# Patient Record
Sex: Male | Born: 1940
Health system: Southern US, Community
[De-identification: ages and names within clinical notes are randomized; demographics above are authoritative.]

## PROBLEM LIST (undated history)

## (undated) DIAGNOSIS — E785 Hyperlipidemia, unspecified: Secondary | ICD-10-CM

## (undated) DIAGNOSIS — F419 Anxiety disorder, unspecified: Secondary | ICD-10-CM

## (undated) HISTORY — PX: TRIGGER FINGER RELEASE: SHX641

## (undated) HISTORY — DX: Hyperlipidemia, unspecified: E78.5

## (undated) HISTORY — DX: Anxiety disorder, unspecified: F41.9

## (undated) HISTORY — PX: TONSILLECTOMY AND ADENOIDECTOMY: SUR1326

## (undated) HISTORY — PX: OTHER SURGICAL HISTORY: SHX169

---

## 2011-10-26 ENCOUNTER — Encounter: Payer: Self-pay | Admitting: Gastroenterology

## 2012-05-29 ENCOUNTER — Other Ambulatory Visit: Payer: Self-pay | Admitting: Internal Medicine

## 2012-05-29 DIAGNOSIS — R0989 Other specified symptoms and signs involving the circulatory and respiratory systems: Secondary | ICD-10-CM

## 2012-06-02 ENCOUNTER — Ambulatory Visit
Admission: RE | Admit: 2012-06-02 | Discharge: 2012-06-02 | Disposition: A | Discharge status: Home / Self Care | Payer: Medicare Other | Source: Ambulatory Visit | Attending: Internal Medicine | Admitting: Internal Medicine

## 2012-06-02 DIAGNOSIS — R0989 Other specified symptoms and signs involving the circulatory and respiratory systems: Secondary | ICD-10-CM

## 2014-08-15 ENCOUNTER — Encounter: Payer: Self-pay | Admitting: Gastroenterology

## 2015-03-27 ENCOUNTER — Encounter: Payer: Self-pay | Admitting: Gastroenterology

## 2015-05-30 ENCOUNTER — Encounter: Payer: Self-pay | Admitting: Gastroenterology

## 2015-08-03 ENCOUNTER — Ambulatory Visit (INDEPENDENT_AMBULATORY_CARE_PROVIDER_SITE_OTHER): Payer: Medicare Other | Admitting: Gastroenterology

## 2015-08-03 ENCOUNTER — Encounter: Payer: Self-pay | Admitting: Gastroenterology

## 2015-08-03 VITALS — BP 122/70 | HR 80 | Ht 69.5 in | Wt 155.4 lb

## 2015-08-03 DIAGNOSIS — Z1211 Encounter for screening for malignant neoplasm of colon: Secondary | ICD-10-CM | POA: Diagnosis not present

## 2015-08-03 DIAGNOSIS — K5730 Diverticulosis of large intestine without perforation or abscess without bleeding: Secondary | ICD-10-CM | POA: Diagnosis not present

## 2015-08-03 MED ORDER — NA SULFATE-K SULFATE-MG SULF 17.5-3.13-1.6 GM/177ML PO SOLN: 1.0000 | Freq: Once | ORAL | Status: DC

## 2015-08-03 NOTE — Assessment & Plan Note (Signed)
Plan screening colonoscopy  CC Dr. Nyoka Cowden

## 2015-08-03 NOTE — Patient Instructions (Signed)

## 2015-08-03 NOTE — Progress Notes (Signed)
    _                                                                                                                History of Present Illness:  Mr. Nicholas Neal is a pleasant 74 year old white male referred at the request of Dr. Nyoka Cowden for screening colonoscopy.  Last examined 2005 was pertinent for scattered right colon diverticula.  He has no GI complaints including change in bowel habits, abdominal pain, melanoma or hematochezia.   Past Medical History  Diagnosis Date  . Hyperlipidemia    Past Surgical History  Procedure Laterality Date  . Trigger finger release Left     pinky finger  . Tonsillectomy and adenoidectomy      as a child   family history includes Alzheimer's disease in his father; Heart disease in his brother; Hypertension in his mother; Stroke in his mother. There is no history of Colon cancer. Current Outpatient Prescriptions  Medication Sig Dispense Refill  . atorvastatin (LIPITOR) 10 MG tablet Take 10 mg by mouth daily.    . MULTIPLE VITAMIN PO Take 1 tablet by mouth daily.     No current facility-administered medications for this visit.   Allergies as of 08/03/2015  . (No Known Allergies)    reports that he has never smoked. He has never used smokeless tobacco. He reports that he drinks alcohol. He reports that he does not use illicit drugs.   Review of Systems: Pertinent positive and negative review of systems were noted in the above HPI section. All other review of systems were otherwise negative.  Vital signs were reviewed in today's medical record Physical Exam: General: Well developed , well nourished, no acute distress Skin: anicteric Head: Normocephalic and atraumatic Eyes:  sclerae anicteric, EOMI Ears: Normal auditory acuity Mouth: No deformity or lesions Neck: Supple, no masses or thyromegaly Lymph Nodes: no lymphadenopathy Lungs: Clear throughout to auscultation Heart: Regular rate and rhythm; no murmurs, rubs or  bruits Gastroinestinal: Soft, non tender and non distended. No masses, hepatosplenomegaly or hernias noted. Normal Bowel sounds Rectal:deferred Musculoskeletal: Symmetrical with no gross deformities  Skin: No lesions on visible extremities Pulses:  Normal pulses noted Extremities: No clubbing, cyanosis, edema or deformities noted Neurological: Alert oriented x 4, grossly nonfocal Cervical Nodes:  No significant cervical adenopathy Inguinal Nodes: No significant inguinal adenopathy Psychological:  Alert and cooperative. Normal mood and affect  See Assessment and Plan under Problem List

## 2015-08-07 ENCOUNTER — Encounter: Payer: Self-pay | Admitting: Gastroenterology

## 2015-08-28 ENCOUNTER — Encounter: Payer: Medicare Other | Admitting: Gastroenterology

## 2015-10-24 ENCOUNTER — Ambulatory Visit (AMBULATORY_SURGERY_CENTER): Payer: Medicare Other | Admitting: Internal Medicine

## 2015-10-24 ENCOUNTER — Encounter: Payer: Self-pay | Admitting: Internal Medicine

## 2015-10-24 VITALS — BP 99/71 | HR 74 | Temp 95.9°F | Resp 14 | Ht 69.0 in | Wt 155.0 lb

## 2015-10-24 DIAGNOSIS — Z1211 Encounter for screening for malignant neoplasm of colon: Secondary | ICD-10-CM | POA: Diagnosis present

## 2015-10-24 MED ORDER — SODIUM CHLORIDE 0.9 % IV SOLN: 500.0000 mL | INTRAVENOUS | Status: DC

## 2015-10-24 NOTE — Op Note (Signed)
[place]  [address]. [place], [address]   COLONOSCOPY PROCEDURE REPORT  PATIENT: Nicholas Neal, Nicholas Neal  MR#: [id number] BIRTHDATE: [birth year] , 56  yrs. old GENDER: male ENDOSCOPIST: Eustace Quail, MD REFERRED [id number] Recall, PROCEDURE DATE:  10/24/2015 PROCEDURE:   Colonoscopy, screening First Screening Colonoscopy - Avg.  risk and is 50 yrs.  old or older - No.  Prior Negative Screening - Now for repeat screening. 10 or more years since last screening  History of Adenoma - Now for follow-up colonoscopy & has been > or = to 3 yrs.  N/A  Polyps removed today? No Recommend repeat exam, <10 yrs? No ASA CLASS:   Class II INDICATIONS:Screening for colonic neoplasia and Colorectal Neoplasm Risk Assessment for this procedure is average risk.. Negative index examination 2005 with Dr. Deatra Ina MEDICATIONS: Monitored anesthesia care and Propofol 200 mg IV  DESCRIPTION OF PROCEDURE:   After the risks benefits and alternatives of the procedure were thoroughly explained, informed consent was obtained.  The digital rectal exam revealed no abnormalities of the rectum.   The LB [id number]  endoscope was introduced through the anus and advanced to the cecum, which was identified by both the appendix and ileocecal valve. No adverse events experienced.   The quality of the prep was excellent. (Suprep was used)  The instrument was then slowly withdrawn as the colon was fully examined. Estimated blood loss is zero unless otherwise noted in this procedure report.     COLON FINDINGS: There was mild diverticulosis noted in the right colon and left colon.   The examination was otherwise normal. Retroflexed views revealed internal hemorrhoids. The time to cecum = 2.8 Withdrawal time = 6.0   The scope was withdrawn and the procedure completed. COMPLICATIONS: There were no immediate complications.  ENDOSCOPIC IMPRESSION: 1.   Mild diverticulosis was noted in the right  colon and left colon  2.   The examination was otherwise normal  RECOMMENDATIONS: 1. Return to the care of your primary provider.  GI follow up as needed  eSigned:  Eustace Quail, MD 10/24/2015 11:21 AM   cc: The Patient and Levin Erp, MD

## 2015-10-24 NOTE — Patient Instructions (Signed)

## 2015-10-24 NOTE — Progress Notes (Signed)
Report to PACU, RN, vss, BBS= Clear.  

## 2015-10-25 ENCOUNTER — Telehealth: Payer: Self-pay | Admitting: *Deleted

## 2015-10-25 NOTE — Telephone Encounter (Signed)
  Follow up Call-  Call back number 10/24/2015  Post procedure Call Back phone  # [phone]  Permission to leave phone message Yes     Patient questions:  Do you have a fever, pain , or abdominal swelling? No. Pain Score  0 *  Have you tolerated food without any problems? Yes.    Have you been able to return to your normal activities? Yes.    Do you have any questions about your discharge instructions: Diet   No. Medications  No. Follow up visit  No.  Do you have questions or concerns about your Care? No.  Actions: * If pain score is 4 or above: No action needed, pain <4.

## 2016-06-14 ENCOUNTER — Other Ambulatory Visit: Payer: Self-pay | Admitting: Internal Medicine

## 2016-06-14 DIAGNOSIS — E041 Nontoxic single thyroid nodule: Secondary | ICD-10-CM

## 2016-06-14 DIAGNOSIS — Z0000 Encounter for general adult medical examination without abnormal findings: Secondary | ICD-10-CM | POA: Diagnosis not present

## 2016-06-14 DIAGNOSIS — E7800 Pure hypercholesterolemia, unspecified: Secondary | ICD-10-CM | POA: Diagnosis not present

## 2016-06-18 ENCOUNTER — Other Ambulatory Visit: Payer: Medicare Other

## 2016-06-24 ENCOUNTER — Ambulatory Visit
Admission: RE | Admit: 2016-06-24 | Discharge: 2016-06-24 | Disposition: A | Discharge status: Home / Self Care | Payer: PPO | Source: Ambulatory Visit | Attending: Internal Medicine | Admitting: Internal Medicine

## 2016-06-24 DIAGNOSIS — E041 Nontoxic single thyroid nodule: Secondary | ICD-10-CM

## 2016-06-24 DIAGNOSIS — E042 Nontoxic multinodular goiter: Secondary | ICD-10-CM | POA: Diagnosis not present

## 2016-10-15 DIAGNOSIS — Z961 Presence of intraocular lens: Secondary | ICD-10-CM | POA: Diagnosis not present

## 2016-10-15 DIAGNOSIS — H40013 Open angle with borderline findings, low risk, bilateral: Secondary | ICD-10-CM | POA: Diagnosis not present

## 2017-06-16 DIAGNOSIS — Z6841 Body Mass Index (BMI) 40.0 and over, adult: Secondary | ICD-10-CM | POA: Diagnosis not present

## 2017-06-16 DIAGNOSIS — E785 Hyperlipidemia, unspecified: Secondary | ICD-10-CM | POA: Diagnosis not present

## 2017-06-16 DIAGNOSIS — Z0000 Encounter for general adult medical examination without abnormal findings: Secondary | ICD-10-CM | POA: Diagnosis not present

## 2017-10-16 DIAGNOSIS — H40013 Open angle with borderline findings, low risk, bilateral: Secondary | ICD-10-CM | POA: Diagnosis not present

## 2017-10-16 DIAGNOSIS — Z961 Presence of intraocular lens: Secondary | ICD-10-CM | POA: Diagnosis not present

## 2018-02-21 ENCOUNTER — Emergency Department (HOSPITAL_BASED_OUTPATIENT_CLINIC_OR_DEPARTMENT_OTHER)
Admission: EM | Admit: 2018-02-21 | Discharge: 2018-02-21 | Disposition: A | Discharge status: Home / Self Care | Payer: PPO | Attending: Emergency Medicine | Admitting: Emergency Medicine

## 2018-02-21 ENCOUNTER — Other Ambulatory Visit: Payer: Self-pay

## 2018-02-21 ENCOUNTER — Encounter (HOSPITAL_BASED_OUTPATIENT_CLINIC_OR_DEPARTMENT_OTHER): Payer: Self-pay | Admitting: Emergency Medicine

## 2018-02-21 DIAGNOSIS — Y999 Unspecified external cause status: Secondary | ICD-10-CM | POA: Diagnosis not present

## 2018-02-21 DIAGNOSIS — Y93H2 Activity, gardening and landscaping: Secondary | ICD-10-CM | POA: Diagnosis not present

## 2018-02-21 DIAGNOSIS — Y92017 Garden or yard in single-family (private) house as the place of occurrence of the external cause: Secondary | ICD-10-CM | POA: Diagnosis not present

## 2018-02-21 DIAGNOSIS — S71122A Laceration with foreign body, left thigh, initial encounter: Secondary | ICD-10-CM | POA: Diagnosis not present

## 2018-02-21 DIAGNOSIS — S81812A Laceration without foreign body, left lower leg, initial encounter: Secondary | ICD-10-CM | POA: Diagnosis not present

## 2018-02-21 DIAGNOSIS — W293XXA Contact with powered garden and outdoor hand tools and machinery, initial encounter: Secondary | ICD-10-CM | POA: Diagnosis not present

## 2018-02-21 MED ORDER — HYDROCODONE-ACETAMINOPHEN 5-325 MG PO TABS: 1.0000 | ORAL_TABLET | Freq: Four times a day (QID) | ORAL | 0 refills | Status: DC | PRN

## 2018-02-21 MED ORDER — CEPHALEXIN 500 MG PO CAPS: 500.0000 mg | ORAL_CAPSULE | Freq: Four times a day (QID) | ORAL | 0 refills | Status: AC

## 2018-02-21 MED ORDER — LIDOCAINE-EPINEPHRINE (PF) 2 %-1:200000 IJ SOLN
20.0000 mL | Freq: Once | INTRAMUSCULAR | Status: AC
  Administered 2018-02-21: 20 mL
  Filled 2018-02-21: qty 20

## 2018-02-21 NOTE — ED Provider Notes (Signed)
[facility] EMERGENCY DEPARTMENT Provider Note   CSN: [id number] Arrival date & time: 02/21/18  1600     History   Chief Complaint Chief Complaint  Patient presents with  . Laceration    HPI Nicholas Neal is a 77 y.o. male with a history of hyperlipidemia presents today for evaluation of a chainsaw wound to his left thigh.  He reports that about 1-1/2 hours prior to arrival he was cutting a tree when the chainsaw got caught on briars and pulled into his left lateral leg.  He reports he had a tetanus shot 1-1/2 years ago with his primary care provider.  He reports that this is an isolated injury.  He has been ambulatory since.  Denies any numbness or tingling in his left foot.  He reports that the wound did not bleed a significant amount.  He initially went to urgent care who diverted him here.   Denies allergies to antibiotics or medications.   HPI  Past Medical History:  Diagnosis Date  . Hyperlipidemia     Patient Active Problem List   Diagnosis Date Noted  . Diverticulosis of colon without hemorrhage 08/03/2015  . Colon cancer screening 08/03/2015    Past Surgical History:  Procedure Laterality Date  . CATARACTS    . TONSILLECTOMY AND ADENOIDECTOMY     as a child  . TRIGGER FINGER RELEASE Left    pinky finger        Home Medications    Prior to Admission medications   Medication Sig Start Date End Date Taking? Authorizing Provider  atorvastatin (LIPITOR) 10 MG tablet Take 10 mg by mouth daily.    [provider]  cephALEXin (KEFLEX) 500 MG capsule Take 1 capsule (500 mg total) by mouth 4 (four) times daily for 7 days. 02/21/18 02/28/18  Lorin Glass, PA-C  HYDROcodone-acetaminophen (NORCO/VICODIN) 5-325 MG tablet Take 1 tablet by mouth every 6 (six) hours as needed. 02/21/18   Lorin Glass, PA-C  MULTIPLE VITAMIN PO Take 1 tablet by mouth daily.    [provider]    Family History Family History  Problem Relation  Age of Onset  . Heart disease Brother   . Stroke Mother   . Hypertension Mother   . Alzheimer's disease Father   . Diabetes Paternal Aunt   . Colon cancer Neg Hx     Social History Social History   Tobacco Use  . Smoking status: Never Smoker  . Smokeless tobacco: Never Used  Substance Use Topics  . Alcohol use: Yes    Alcohol/week: 0.0 oz    Comment: daily wine  . Drug use: No     Allergies   Patient has no known allergies.   Review of Systems Review of Systems  Constitutional: Negative for fatigue and fever.  Eyes: Negative for visual disturbance.  Skin: Positive for wound.       To left thigh.   Neurological: Negative for headaches.  All other systems reviewed and are negative.    Physical Exam Updated Vital Signs BP 138/66 (BP Location: Left Arm)   Pulse 78   Temp 98.3 F (36.8 C) (Oral)   Resp 16   Ht 5\' 10"  (1.778 m)   Wt 69.9 kg (154 lb)   SpO2 97%   BMI 22.10 kg/m   Physical Exam  Constitutional: He appears well-developed and well-nourished. No distress.  HENT:  Head: Normocephalic and atraumatic.  Eyes: Conjunctivae are normal. Right eye exhibits no discharge. Left  eye exhibits no discharge. No scleral icterus.  Neck: Normal range of motion.  Cardiovascular: Normal rate, regular rhythm and intact distal pulses.  2+ DP/PT pulses on left foot.  Pulmonary/Chest: Effort normal. No stridor. No respiratory distress.  Abdominal: He exhibits no distension.  Musculoskeletal: He exhibits no edema or deformity.  Neurological: He is alert. He exhibits normal muscle tone.  Sensation and motor function intact to left distal leg.  Skin: Skin is warm and dry. He is not diaphoretic.  There is a 7 cm laceration over patient's distal anterior lateral left thigh.  Wound is through dermis into subcutaneous tissue.  There are multiple organic foreign bodies and fibers from his shredded chains.  1.  Wound appears to be cauterized by the chainsaw.  Psychiatric: He  has a normal mood and affect. His behavior is normal.  Nursing note and vitals reviewed.      ED Treatments / Results  Labs (all labs ordered are listed, but only abnormal results are displayed) Labs Reviewed - No data to display  EKG None  Radiology No results found.  Procedures .Marland KitchenLaceration Repair Date/Time: 02/21/2018 7:12 PM Performed by: Lorin Glass, PA-C Authorized by: Lorin Glass, PA-C   Consent:    Consent obtained:  Verbal   Consent given by:  Patient   Risks discussed:  Infection, need for additional repair, poor cosmetic result, pain, retained foreign body, tendon damage, vascular damage, poor wound healing and nerve damage (Retained fibers or organic material)   Alternatives discussed:  No treatment and referral (Alternative wound closures) Anesthesia (see MAR for exact dosages):    Anesthesia method:  Local infiltration   Local anesthetic:  Lidocaine 2% WITH epi Laceration details:    Location:  Leg   Leg location:  L upper leg   Length (cm):  7 Repair type:    Repair type:  Intermediate Pre-procedure details:    Preparation:  Patient was prepped and draped in usual sterile fashion Exploration:    Hemostasis achieved with:  Epinephrine and direct pressure (Minimal bleeding)   Wound exploration: wound explored through full range of motion and entire depth of wound probed and visualized     Wound extent comment:  Wound is through dermis, into subcutaneous fat.   Contaminated: yes   Treatment:    Area cleansed with:  Saline   Amount of cleaning:  Extensive   Irrigation solution:  Sterile saline   Irrigation method:  Pressure wash   Visualized foreign bodies/material removed: yes   Skin repair:    Repair method:  Sutures   Suture size:  4-0   Suture material:  Prolene   Suture technique: Simple interrupted, vertical mattress.   Number of sutures:  11 Approximation:    Approximation:  Close Post-procedure details:    Dressing:   Antibiotic ointment, bulky dressing, non-adherent dressing and sterile dressing   Patient tolerance of procedure:  Tolerated well, no immediate complications Comments:     Patient advised that as it goes against the skin tension lines is at high risk for dehiscence.    (including critical care time)  Medications Ordered in ED Medications  lidocaine-EPINEPHrine (XYLOCAINE W/EPI) 2 %-1:200000 (PF) injection 20 mL (20 mLs Infiltration Given by Other 02/21/18 1757)     Initial Impression / Assessment and Plan / ED Course  I have reviewed the triage vital signs and the nursing notes.  Pertinent labs & imaging results that were available during my care of the patient were reviewed by me  and considered in my medical decision making (see chart for details).    Pressure irrigation performed. Wound explored and base of wound visualized in a bloodless field without evidence of foreign body.  Laceration occurred < 8 hours prior to repair which was well tolerated. Tdap up to date per patient.  Pt has  no significant comorbidities to effect normal wound healing other than age. Pt discharged with antibiotics.  Discussed suture home care with patient and answered questions. Pt to follow-up for wound check and suture removal in 14 days; they are to return to the ED sooner for signs of infection. Pt is hemodynamically stable with no complaints prior to dc.   Final Clinical Impressions(s) / ED Diagnoses   Final diagnoses:  Contact with chainsaw as cause of accidental injury  Laceration of left lower extremity, initial encounter    ED Discharge Orders        Ordered    HYDROcodone-acetaminophen (NORCO/VICODIN) 5-325 MG tablet  Every 6 hours PRN     02/21/18 1901    cephALEXin (KEFLEX) 500 MG capsule  4 times daily     02/21/18 1901       Ollen Gross 02/21/18 Docia Chuck    Charlesetta Shanks, MD 02/27/18 1520

## 2018-02-21 NOTE — ED Notes (Signed)
ED Provider at bedside. Pa at bedside for suturing

## 2018-02-21 NOTE — ED Triage Notes (Signed)
Laceration to L thigh from a chainsaw. Bleeding controlled.

## 2018-02-21 NOTE — ED Notes (Signed)
Pt and wife given d/c instructions as per chart. Rx x 2 with precautions. Verbalize understanding. No questions.

## 2018-02-21 NOTE — Discharge Instructions (Addendum)
Please get your stitches removed in 14 days.  For the first day please keep the dressing on.  After one day you may change the dressing.  Do not get it wet until 48 hours after sutures.  Do not submerge your wound/leg.    Please take Ibuprofen (Advil, motrin) and Tylenol (acetaminophen) to relieve your pain.  You may take up to 600 MG (3 pills) of normal strength ibuprofen every 8 hours as needed.  In between doses of ibuprofen you make take tylenol, up to 1,000 mg (two extra strength pills).  Do not take more than 3,000 mg tylenol in a 24 hour period.  Please check all medication labels as many medications such as pain and cold medications may contain tylenol.  Do not drink alcohol while taking these medications.  Do not take other NSAID'S while taking ibuprofen (such as aleve or naproxen).  Please take ibuprofen with food to decrease stomach upset.  You are being prescribed a medication which may make you sleepy. For 24 hours after one dose please do not drive, operate heavy machinery, care for a small child with out another adult present, or perform any activities that may cause harm to you or someone else if you were to fall asleep or be impaired.   You may have diarrhea from the antibiotics.  It is very important that you continue to take the antibiotics even if you get diarrhea unless a medical professional tells you that you may stop taking them.  If you stop too early the bacteria you are being treated for will become stronger and you may need different, more powerful antibiotics that have more side effects and worsening diarrhea.  Please stay well hydrated and consider probiotics as they may decrease the severity of your diarrhea.

## 2018-02-27 DIAGNOSIS — S71112A Laceration without foreign body, left thigh, initial encounter: Secondary | ICD-10-CM | POA: Diagnosis not present

## 2018-03-03 DIAGNOSIS — S81812D Laceration without foreign body, left lower leg, subsequent encounter: Secondary | ICD-10-CM | POA: Diagnosis not present

## 2018-03-03 DIAGNOSIS — E785 Hyperlipidemia, unspecified: Secondary | ICD-10-CM | POA: Diagnosis not present

## 2018-03-17 DIAGNOSIS — S81812A Laceration without foreign body, left lower leg, initial encounter: Secondary | ICD-10-CM | POA: Diagnosis not present

## 2018-06-11 DIAGNOSIS — M79644 Pain in right finger(s): Secondary | ICD-10-CM | POA: Diagnosis not present

## 2018-06-11 DIAGNOSIS — M720 Palmar fascial fibromatosis [Dupuytren]: Secondary | ICD-10-CM | POA: Diagnosis not present

## 2018-06-16 DIAGNOSIS — E785 Hyperlipidemia, unspecified: Secondary | ICD-10-CM | POA: Diagnosis not present

## 2018-06-16 DIAGNOSIS — Z6841 Body Mass Index (BMI) 40.0 and over, adult: Secondary | ICD-10-CM | POA: Diagnosis not present

## 2018-06-16 DIAGNOSIS — Z125 Encounter for screening for malignant neoplasm of prostate: Secondary | ICD-10-CM | POA: Diagnosis not present

## 2018-06-16 DIAGNOSIS — E039 Hypothyroidism, unspecified: Secondary | ICD-10-CM | POA: Diagnosis not present

## 2018-07-03 ENCOUNTER — Other Ambulatory Visit: Payer: Self-pay

## 2018-07-03 ENCOUNTER — Emergency Department (HOSPITAL_COMMUNITY): Payer: PPO

## 2018-07-03 ENCOUNTER — Encounter (HOSPITAL_COMMUNITY): Payer: Self-pay

## 2018-07-03 ENCOUNTER — Emergency Department (HOSPITAL_COMMUNITY)
Admission: EM | Admit: 2018-07-03 | Discharge: 2018-07-04 | Disposition: A | Discharge status: Home / Self Care | Payer: PPO | Attending: Emergency Medicine | Admitting: Emergency Medicine

## 2018-07-03 DIAGNOSIS — S61254A Open bite of right ring finger without damage to nail, initial encounter: Secondary | ICD-10-CM | POA: Diagnosis not present

## 2018-07-03 DIAGNOSIS — W5911XA Bitten by nonvenomous snake, initial encounter: Secondary | ICD-10-CM | POA: Insufficient documentation

## 2018-07-03 DIAGNOSIS — S61250A Open bite of right index finger without damage to nail, initial encounter: Secondary | ICD-10-CM | POA: Insufficient documentation

## 2018-07-03 DIAGNOSIS — S61451A Open bite of right hand, initial encounter: Secondary | ICD-10-CM | POA: Diagnosis not present

## 2018-07-03 DIAGNOSIS — M79644 Pain in right finger(s): Secondary | ICD-10-CM

## 2018-07-03 DIAGNOSIS — Y999 Unspecified external cause status: Secondary | ICD-10-CM | POA: Diagnosis not present

## 2018-07-03 DIAGNOSIS — Y929 Unspecified place or not applicable: Secondary | ICD-10-CM | POA: Insufficient documentation

## 2018-07-03 DIAGNOSIS — Y93K1 Activity, walking an animal: Secondary | ICD-10-CM | POA: Diagnosis not present

## 2018-07-03 DIAGNOSIS — X58XXXA Exposure to other specified factors, initial encounter: Secondary | ICD-10-CM | POA: Diagnosis not present

## 2018-07-03 NOTE — ED Notes (Signed)
ED Provider at bedside. 

## 2018-07-03 NOTE — ED Triage Notes (Signed)
Pt reports a copperhead bite to his L ring finger. Small puncture noted with blue surrounding. He believes the snake to be a copperhead d/t seeing one there previously, but he did not see the snake. Denies SOB, chest pain, or dizziness. A&Ox4.

## 2018-07-03 NOTE — ED Provider Notes (Signed)
[facility] Emergency Department Provider Note MRN:  [id number]  Arrival date & time: 07/04/18     Chief Complaint   Snake Bite   History of Present Illness   Nicholas Neal is a 77 y.o. year-old male with a history of hyperlipidemia presenting to the ED with chief complaint of snakebite.  Patient was walking his dog the dog noticed a snake.  The dog was afraid of the snake, the patient reached down to show the dog that the snake was done dangerous.  It was at that time that the snake but the patient on the ring finger.  Sudden onset sharp pain, single puncture wound.  Pain is constant, worse with motion.  Denies headache or vision change, no chest pain or shortness of breath, no abdominal pain, no dysuria.  Thinks it was a copperhead.  Review of Systems  A complete 10 system review of systems was obtained and all systems are negative except as noted in the HPI and PMH.   Patient's Health History    Past Medical History:  Diagnosis Date  . Hyperlipidemia     Past Surgical History:  Procedure Laterality Date  . CATARACTS    . TONSILLECTOMY AND ADENOIDECTOMY     as a child  . TRIGGER FINGER RELEASE Left    pinky finger    Family History  Problem Relation Age of Onset  . Heart disease Brother   . Stroke Mother   . Hypertension Mother   . Alzheimer's disease Father   . Diabetes Paternal Aunt   . Colon cancer Neg Hx     Social History   Socioeconomic History  . Marital status: Married    Spouse name: Not on file  . Number of children: 0  . Years of education: Not on file  . Highest education level: Not on file  Occupational History  . Occupation: Retired Public house manager  . Financial resource strain: Not on file  . Food insecurity:    Worry: Not on file    Inability: Not on file  . Transportation needs:    Medical: Not on file    Non-medical: Not on file  Tobacco Use  . Smoking status: Never Smoker  . Smokeless tobacco: Never  Used  Substance and Sexual Activity  . Alcohol use: Yes    Alcohol/week: 0.0 standard drinks    Comment: daily wine  . Drug use: No  . Sexual activity: Not on file  Lifestyle  . Physical activity:    Days per week: Not on file    Minutes per session: Not on file  . Stress: Not on file  Relationships  . Social connections:    Talks on phone: Not on file    Gets together: Not on file    Attends religious service: Not on file    Active member of club or organization: Not on file    Attends meetings of clubs or organizations: Not on file    Relationship status: Not on file  . Intimate partner violence:    Fear of current or ex partner: Not on file    Emotionally abused: Not on file    Physically abused: Not on file    Forced sexual activity: Not on file  Other Topics Concern  . Not on file  Social History Narrative  . Not on file     Physical Exam  Vital Signs and Nursing Notes reviewed Vitals:   07/03/18 2120 07/03/18 2316  BP: (!) 142/71 121/65  Pulse: 82 (!) 58  Resp: 16 18  Temp: 98.4 F (36.9 C)   SpO2: 95% 100%    CONSTITUTIONAL: Well-appearing, NAD NEURO:  Alert and oriented x 3, no focal deficits EYES:  eyes equal and reactive ENT/NECK:  no LAD, no JVD CARDIO: Regular rate, well-perfused, normal S1 and S2 PULM:  CTAB no wheezing or rhonchi GI/GU:  normal bowel sounds, non-distended, non-tender MSK/SPINE:  No gross deformities, no edema SKIN:  no rash, atraumatic, single puncture wound to the proximal right ring finger, no significant surrounding edema or tenderness PSYCH:  Appropriate speech and behavior  Diagnostic and Interventional Summary    EKG Interpretation  Date/Time:    Ventricular Rate:    PR Interval:    QRS Duration:   QT Interval:    QTC Calculation:   R Axis:     Text Interpretation:        Labs Reviewed  BASIC METABOLIC PANEL - Abnormal; Notable for the following components:      Result Value   Glucose, Bld 112 (*)    All  other components within normal limits  CBC  FIBRINOGEN  PROTIME-INR    DG Hand Complete Right  Final Result      Medications - No data to display   Procedures Critical Care  ED Course and Medical Decision Making  I have reviewed the triage vital signs and the nursing notes.  Pertinent labs & imaging results that were available during my care of the patient were reviewed by me and considered in my medical decision making (see below for details). Clinical Course as of Jul 05 7  Fri Jul 03, 2018  2221 Favoring dry bite given lack of local tissue response.  Will obtain x-ray to rule out retained Annamaria Boots, observe for period of time in the ED.   [MB]    Clinical Course User Index [MB] Maudie Flakes, MD    No worsening of edema or pain on reassessment.  Per most recent recommendation, will obtain labs to ensure no systemic toxicity related to coagulability.  Will observe for 8 hours, plan to discharge at 4:30 AM if no change in clinical condition.  Would consider [employer] if considerable clinical change related to local tissue damage, would use shared decision making if this were to become possibility.  Patient signed out to Dr. Tyrone Nine at shift change.  Barth Kirks. Sedonia Small, Nil[place] [email or web address]  Final Clinical Impressions(s) / ED Diagnoses     ICD-10-CM   1. Snake bite, initial encounter W59.11XA   2. Pain of finger of right hand M79.644     ED Discharge Orders    None         Maudie Flakes, MD 07/04/18 [phone]

## 2018-07-04 LAB — CBC
HEMATOCRIT: 40.7 % (ref 39.0–52.0)
Hemoglobin: 13.8 g/dL (ref 13.0–17.0)
MCH: 32.3 pg (ref 26.0–34.0)
MCHC: 33.9 g/dL (ref 30.0–36.0)
MCV: 95.3 fL (ref 78.0–100.0)
Platelets: 205 10*3/uL (ref 150–400)
RBC: 4.27 MIL/uL (ref 4.22–5.81)
RDW: 13.7 % (ref 11.5–15.5)
WBC: 6 10*3/uL (ref 4.0–10.5)

## 2018-07-04 LAB — BASIC METABOLIC PANEL
ANION GAP: 11 (ref 5–15)
BUN: 23 mg/dL (ref 8–23)
CALCIUM: 9.4 mg/dL (ref 8.9–10.3)
CO2: 26 mmol/L (ref 22–32)
CREATININE: 0.98 mg/dL (ref 0.61–1.24)
Chloride: 107 mmol/L (ref 98–111)
GFR calc Af Amer: >60 mL/min (ref >60)
GFR calc non Af Amer: >60 mL/min (ref >60)
GLUCOSE: 112 mg/dL — AB (ref 70–99)
Potassium: 4.1 mmol/L (ref 3.5–5.1)
Sodium: 144 mmol/L (ref 135–145)

## 2018-07-04 LAB — PROTIME-INR
INR: 0.93
Prothrombin Time: 12.4 seconds (ref 11.4–15.2)

## 2018-07-04 LAB — FIBRINOGEN: Fibrinogen: 280 mg/dL (ref 210–475)

## 2018-07-04 NOTE — ED Notes (Signed)
Provider at bedside discussing treatment plan. Pt agreeable. Pt ambulatory to waiting room.

## 2018-07-04 NOTE — ED Notes (Signed)
Discharge instructions reviewed with pt. Pt verbalized understanding. Pt to follow up with PCP. PIV removed. Pt ambulatory to waiting room.

## 2018-07-04 NOTE — ED Provider Notes (Signed)
I received this patient in signout from Dr. Ezekiel Ina, briefly patient is a 77 year old male that was bit by a snake earlier this evening.  Plan was to await for the 8 hours and reassess for any worsening.  I evaluated the patient about 6 hours post bite, he feels that the localized swelling is improving.  No significant change.  D/c home.      Deno Etienne, DO 07/04/18 [phone]

## 2018-07-23 DIAGNOSIS — F430 Acute stress reaction: Secondary | ICD-10-CM | POA: Diagnosis not present

## 2018-07-24 DIAGNOSIS — F430 Acute stress reaction: Secondary | ICD-10-CM | POA: Diagnosis not present

## 2018-08-20 DIAGNOSIS — R251 Tremor, unspecified: Secondary | ICD-10-CM | POA: Diagnosis not present

## 2018-08-25 DIAGNOSIS — I10 Essential (primary) hypertension: Secondary | ICD-10-CM | POA: Diagnosis not present

## 2018-08-25 DIAGNOSIS — E785 Hyperlipidemia, unspecified: Secondary | ICD-10-CM | POA: Diagnosis not present

## 2018-08-25 DIAGNOSIS — M6281 Muscle weakness (generalized): Secondary | ICD-10-CM | POA: Diagnosis not present

## 2018-08-25 DIAGNOSIS — R450 Nervousness: Secondary | ICD-10-CM | POA: Diagnosis not present

## 2018-08-25 DIAGNOSIS — E7800 Pure hypercholesterolemia, unspecified: Secondary | ICD-10-CM | POA: Diagnosis not present

## 2018-08-25 DIAGNOSIS — R799 Abnormal finding of blood chemistry, unspecified: Secondary | ICD-10-CM | POA: Diagnosis not present

## 2018-09-16 DIAGNOSIS — F419 Anxiety disorder, unspecified: Secondary | ICD-10-CM | POA: Diagnosis not present

## 2018-10-07 DIAGNOSIS — F419 Anxiety disorder, unspecified: Secondary | ICD-10-CM | POA: Diagnosis not present

## 2018-11-20 DIAGNOSIS — F419 Anxiety disorder, unspecified: Secondary | ICD-10-CM | POA: Diagnosis not present

## 2019-06-16 ENCOUNTER — Emergency Department (HOSPITAL_BASED_OUTPATIENT_CLINIC_OR_DEPARTMENT_OTHER)
Admission: EM | Admit: 2019-06-16 | Discharge: 2019-06-16 | Disposition: A | Discharge status: Home / Self Care | Payer: PPO | Attending: Emergency Medicine | Admitting: Emergency Medicine

## 2019-06-16 ENCOUNTER — Emergency Department (HOSPITAL_BASED_OUTPATIENT_CLINIC_OR_DEPARTMENT_OTHER): Payer: PPO

## 2019-06-16 ENCOUNTER — Other Ambulatory Visit: Payer: Self-pay

## 2019-06-16 ENCOUNTER — Encounter (HOSPITAL_BASED_OUTPATIENT_CLINIC_OR_DEPARTMENT_OTHER): Payer: Self-pay | Admitting: Emergency Medicine

## 2019-06-16 DIAGNOSIS — N200 Calculus of kidney: Secondary | ICD-10-CM | POA: Diagnosis not present

## 2019-06-16 DIAGNOSIS — N132 Hydronephrosis with renal and ureteral calculous obstruction: Secondary | ICD-10-CM | POA: Diagnosis not present

## 2019-06-16 DIAGNOSIS — R1084 Generalized abdominal pain: Secondary | ICD-10-CM | POA: Diagnosis present

## 2019-06-16 LAB — COMPREHENSIVE METABOLIC PANEL WITH GFR
ALT: 17 U/L (ref 0–44)
AST: 22 U/L (ref 15–41)
Albumin: 4.8 g/dL (ref 3.5–5.0)
Alkaline Phosphatase: 61 U/L (ref 38–126)
Anion gap: 13 (ref 5–15)
BUN: 21 mg/dL (ref 8–23)
CO2: 23 mmol/L (ref 22–32)
Calcium: 9.6 mg/dL (ref 8.9–10.3)
Chloride: 104 mmol/L (ref 98–111)
Creatinine, Ser: 1.33 mg/dL — ABNORMAL HIGH (ref 0.61–1.24)
GFR calc Af Amer: 59 mL/min — ABNORMAL LOW
GFR calc non Af Amer: 51 mL/min — ABNORMAL LOW
Glucose, Bld: 150 mg/dL — ABNORMAL HIGH (ref 70–99)
Potassium: 4.2 mmol/L (ref 3.5–5.1)
Sodium: 140 mmol/L (ref 135–145)
Total Bilirubin: 0.7 mg/dL (ref 0.3–1.2)
Total Protein: 7.8 g/dL (ref 6.5–8.1)

## 2019-06-16 LAB — CBC WITH DIFFERENTIAL/PLATELET
Abs Immature Granulocytes: 0.04 10*3/uL (ref 0.00–0.07)
Basophils Absolute: 0 10*3/uL (ref 0.0–0.1)
Basophils Relative: 0 %
Eosinophils Absolute: 0 10*3/uL (ref 0.0–0.5)
Eosinophils Relative: 0 %
HCT: 48.2 % (ref 39.0–52.0)
Hemoglobin: 15.4 g/dL (ref 13.0–17.0)
Immature Granulocytes: 0 %
Lymphocytes Relative: 7 %
Lymphs Abs: 0.8 10*3/uL (ref 0.7–4.0)
MCH: 30.7 pg (ref 26.0–34.0)
MCHC: 32 g/dL (ref 30.0–36.0)
MCV: 96 fL (ref 80.0–100.0)
Monocytes Absolute: 0.4 10*3/uL (ref 0.1–1.0)
Monocytes Relative: 3 %
Neutro Abs: 10.2 10*3/uL — ABNORMAL HIGH (ref 1.7–7.7)
Neutrophils Relative %: 90 %
Platelets: 208 10*3/uL (ref 150–400)
RBC: 5.02 MIL/uL (ref 4.22–5.81)
RDW: 13.3 % (ref 11.5–15.5)
WBC: 11.4 10*3/uL — ABNORMAL HIGH (ref 4.0–10.5)
nRBC: 0 % (ref 0.0–0.2)

## 2019-06-16 LAB — LIPASE, BLOOD: Lipase: 30 U/L (ref 11–51)

## 2019-06-16 LAB — URINALYSIS, MICROSCOPIC (REFLEX)

## 2019-06-16 LAB — URINALYSIS, ROUTINE W REFLEX MICROSCOPIC
Bilirubin Urine: NEGATIVE
Glucose, UA: NEGATIVE mg/dL
Ketones, ur: NEGATIVE mg/dL
Leukocytes,Ua: NEGATIVE
Nitrite: NEGATIVE
Protein, ur: NEGATIVE mg/dL
Specific Gravity, Urine: >1.03 — ABNORMAL HIGH (ref 1.005–1.030)
pH: 5.5 (ref 5.0–8.0)

## 2019-06-16 MED ORDER — IOHEXOL 300 MG/ML  SOLN
100.0000 mL | Freq: Once | INTRAMUSCULAR | Status: AC | PRN
  Administered 2019-06-16: 100 mL via INTRAVENOUS

## 2019-06-16 MED ORDER — HYDROCODONE-ACETAMINOPHEN 5-325 MG PO TABS: 1.0000 | ORAL_TABLET | Freq: Four times a day (QID) | ORAL | 0 refills | Status: DC | PRN

## 2019-06-16 MED ORDER — ONDANSETRON HCL 4 MG/2ML IJ SOLN
4.0000 mg | Freq: Once | INTRAMUSCULAR | Status: AC
  Administered 2019-06-16: 11:00:00 4 mg via INTRAVENOUS
  Filled 2019-06-16: qty 2

## 2019-06-16 MED ORDER — SODIUM CHLORIDE 0.9 % IV BOLUS
1000.0000 mL | Freq: Once | INTRAVENOUS | Status: AC
  Administered 2019-06-16: 11:00:00 1000 mL via INTRAVENOUS

## 2019-06-16 MED FILL — HYDROCODON-APAP 5-325: 5-325 | 3 days supply | Qty: 15 | Fill #0

## 2019-06-16 NOTE — ED Notes (Signed)
ED Provider at bedside. 

## 2019-06-16 NOTE — ED Notes (Signed)
Pt vomiting,  EDP notified 

## 2019-06-16 NOTE — ED Notes (Signed)
Patient transported to CT 

## 2019-06-16 NOTE — ED Provider Notes (Signed)
[facility] EMERGENCY DEPARTMENT Provider Note   CSN: [id number] Arrival date & time: 06/16/19  2458     History   Chief Complaint Chief Complaint  Patient presents with  . Abdominal Pain    HPI Nicholas Neal is a 78 y.o. adult.     Patient is a 78 year old male with past medical history of hyperlipidemia.  He presents today with complaints of abdominal discomfort.  This began yesterday evening and is worsening.  He states that he felt as though he had to have a bowel movement this morning, however did not occur.  He denies any nausea or vomiting.  He denies any fevers or chills.  Patient has had regular colonoscopies in the past with no issues identified.  He denies any prior abdominal surgeries.  He does report occasional constipation and irregular bowel movements on and off for the past several months.  The history is provided by the patient.  Abdominal Pain Pain location:  Generalized Pain quality: cramping   Pain radiates to:  Does not radiate Pain severity:  Moderate Onset quality:  Sudden Duration:  1 day Timing:  Constant Progression:  Worsening Chronicity:  New Relieved by:  Nothing Worsened by:  Nothing Ineffective treatments:  None tried   Past Medical History:  Diagnosis Date  . Hyperlipidemia     Patient Active Problem List   Diagnosis Date Noted  . Diverticulosis of colon without hemorrhage 08/03/2015  . Colon cancer screening 08/03/2015    Past Surgical History:  Procedure Laterality Date  . CATARACTS    . TONSILLECTOMY AND ADENOIDECTOMY     as a child  . TRIGGER FINGER RELEASE Left    pinky finger        Home Medications    Prior to Admission medications   Not on File    Family History Family History  Problem Relation Age of Onset  . Heart disease Brother   . Stroke Mother   . Hypertension Mother   . Alzheimer's disease Father   . Diabetes Paternal Aunt   . Colon cancer Neg Hx     Social History Social History    Tobacco Use  . Smoking status: Never Smoker  . Smokeless tobacco: Never Used  Substance Use Topics  . Alcohol use: Yes    Alcohol/week: 0.0 standard drinks    Comment: daily wine  . Drug use: No     Allergies   Patient has no known allergies.   Review of Systems Review of Systems  Gastrointestinal: Positive for abdominal pain.  All other systems reviewed and are negative.    Physical Exam Updated Vital Signs BP (!) 177/74 (BP Location: Right Arm)   Pulse 64   Temp 97.9 F (36.6 C) (Oral)   Resp 16   Ht 5\' 10"  (1.778 m)   Wt 68.9 kg   SpO2 97%   BMI 21.81 kg/m   Physical Exam Vitals signs and nursing note reviewed.  Constitutional:      General: She is not in acute distress.    Appearance: She is well-developed. She is not diaphoretic.  HENT:     Head: Normocephalic and atraumatic.  Neck:     Musculoskeletal: Normal range of motion and neck supple.  Cardiovascular:     Rate and Rhythm: Normal rate and regular rhythm.     Heart sounds: No murmur. No friction rub.  Pulmonary:     Effort: Pulmonary effort is normal. No respiratory distress.     Breath  sounds: Normal breath sounds. No wheezing or rales.  Abdominal:     General: Bowel sounds are normal. There is no distension.     Palpations: Abdomen is soft.     Tenderness: There is abdominal tenderness. There is no right CVA tenderness, left CVA tenderness, guarding or rebound.     Comments: There is mild generalized abdominal tenderness.  Musculoskeletal: Normal range of motion.  Skin:    General: Skin is warm and dry.  Neurological:     Mental Status: She is alert and oriented to person, place, and time.     Coordination: Coordination normal.      ED Treatments / Results  Labs (all labs ordered are listed, but only abnormal results are displayed) Labs Reviewed  COMPREHENSIVE METABOLIC PANEL  LIPASE, BLOOD  CBC WITH DIFFERENTIAL/PLATELET  URINALYSIS, ROUTINE W REFLEX MICROSCOPIC    EKG None   Radiology No results found.  Procedures Procedures (including critical care time)  Medications Ordered in ED Medications  sodium chloride 0.9 % bolus 1,000 mL (has no administration in time range)     Initial Impression / Assessment and Plan / ED Course  I have reviewed the triage vital signs and the nursing notes.  Pertinent labs & imaging results that were available during my care of the patient were reviewed by me and considered in my medical decision making (see chart for details).  Patient presenting here with complaints of abdominal pain as described in the HPI.  The patient's laboratory studies are essentially unremarkable.  His urinalysis does show blood and CT scan shows a 2 mm calculus at the right UVJ with hydronephrosis and perinephric stranding.  This appears to be the cause of his discomfort.  Patient is concerned because he has not had a bowel movement in the last 2 days, but there is no evidence for fecal impaction or constipation.  He is feeling better after medications given here in the ER and I believe is appropriate for discharge.  Patient will be given pain medication and is to follow-up with urology if not improving in the next few days.  Final Clinical Impressions(s) / ED Diagnoses   Final diagnoses:  None    ED Discharge Orders    None       Veryl Speak, MD 06/16/19 1251

## 2019-06-16 NOTE — ED Triage Notes (Signed)
Generalized abd pain that started this morning. Unable to keep anything down. Bowels have not moved in a few days.

## 2019-06-16 NOTE — Discharge Instructions (Addendum)
Begin taking hydrocodone as prescribed today as needed for pain.  Follow-up with [facility] if symptoms or not improving in the next 3 to 4 days.  Their contact information has been provided in this discharge summary for you to call and make these arrangements.  Return to the emergency department in the meantime if you develop high fever, worsening pain, or other new and concerning symptoms.

## 2019-06-18 DIAGNOSIS — F064 Anxiety disorder due to known physiological condition: Secondary | ICD-10-CM | POA: Diagnosis not present

## 2019-06-18 DIAGNOSIS — F419 Anxiety disorder, unspecified: Secondary | ICD-10-CM | POA: Diagnosis not present

## 2019-06-18 DIAGNOSIS — N200 Calculus of kidney: Secondary | ICD-10-CM | POA: Diagnosis not present

## 2019-06-18 DIAGNOSIS — E7800 Pure hypercholesterolemia, unspecified: Secondary | ICD-10-CM | POA: Diagnosis not present

## 2019-06-21 DIAGNOSIS — R8290 Unspecified abnormal findings in urine: Secondary | ICD-10-CM | POA: Diagnosis not present

## 2019-08-18 DIAGNOSIS — Z961 Presence of intraocular lens: Secondary | ICD-10-CM | POA: Diagnosis not present

## 2019-08-18 DIAGNOSIS — H40013 Open angle with borderline findings, low risk, bilateral: Secondary | ICD-10-CM | POA: Diagnosis not present

## 2019-09-11 DIAGNOSIS — Z131 Encounter for screening for diabetes mellitus: Secondary | ICD-10-CM | POA: Diagnosis not present

## 2019-09-11 NOTE — Telephone Encounter (Signed)
 COVID-19  Patient Reached?: Yes  Patient Disposition:: Proceed to [facility]  In the Past 7 days have you experienced any of these symptoms (Fever, Cough, Shortnesss of breath): None  In the past 14 days have you tested for Covid19 with a Postive or Unknown result?: No    Call [employer]  Call being placed to:: Patient         Call Reason (PT)  What is the reason for the call?: COVID-19                                                 COVID-19  Patient Reached?: Yes  Patient Disposition:: Proceed to [facility]  In the Past 7 days have you experienced any of these symptoms (Fever, Cough, Shortnesss of breath): None  In the past 14 days have you tested for Covid19 with a Postive or Unknown result?: No

## 2019-09-11 NOTE — Progress Notes (Signed)
 Subjective:  Patient ID: Nicholas Neal is a 78 y.o. male.  HPI  Patient presents for fasting glucose check. Patient states that his PCP wanted him to have his BG checked due to intermittent complaints of fatigue for the past 6 months. Patient states that he is not fatigued today.  Diabetes Monitoring/Screening &Treatment Patient presents with: blood sugar concern   Visit type: initial   Have you seen a PCP for this condition: Yes   Do you have a scheduled appointment: Yes   Reason for visit:  Screening Diabetes type:  Never been diagnosed Is patient symptomatic: No   Duration of current symptoms:  6 months Associated symptoms: fatigue, weakness (denies decreased strength; endorses weak sensation), dizziness and mood disturbance   Associated symptoms: no blurred vision, no chest pain, no diabetic associated foot paresthesias, no foot ulcerations, no polydipsia, no polyphagia, no polyuria, no visual change, no unplanned weight loss, no confusion, no headaches, no hunger, no agitation, no pallor, no seizures, no sleepiness, no speech change, no sweats, no tremors, no possible hypoglycemia, no hyperglycemia, no abdominal discomfort, no blackouts, not incontinent at night, no impaired healing, no nausea and no vomiting   Symptom course:  Stable CAD risks: dyslipidemia, male sex and advanced age   Current treatments:  Exercise        Review of Systems  Constitutional: Positive for fatigue.  Eyes: Negative for blurred vision.  Cardiovascular: Negative for chest pain.  Gastrointestinal: Negative for nausea and vomiting.  Endocrine: Negative for polydipsia, polyphagia and polyuria.  Skin: Negative for pallor and poor wound healing.  Neurological: Positive for dizziness. Negative for tremors, speech change, seizures and headaches.  Psychiatric/Behavioral: Negative for agitation and confusion.  All other systems reviewed and are negative.   Social History   Tobacco Use  Smoking Status  Never Smoker  Smokeless Tobacco Never Used   Past Medical History:  Diagnosis Date  . HLD (hyperlipidemia)    History reviewed. No pertinent surgical history. No family history on file. Objective: Physical Exam Vitals signs reviewed.  Constitutional:      General: He is not in acute distress.    Appearance: He is well-developed. He is not ill-appearing or diaphoretic.  HENT:     Head: Normocephalic and atraumatic.     Right Ear: Hearing, tympanic membrane, ear canal and external ear normal.     Left Ear: Hearing, tympanic membrane, ear canal and external ear normal.     Nose: Nose normal. No mucosal edema or rhinorrhea.     Right Sinus: No maxillary sinus tenderness or frontal sinus tenderness.     Left Sinus: No maxillary sinus tenderness or frontal sinus tenderness.     Mouth/Throat:     Mouth: No oral lesions.     Dentition: Normal dentition.     Pharynx: Uvula midline. No oropharyngeal exudate or posterior oropharyngeal erythema.     Tonsils: No tonsillar abscesses.  Eyes:     General: Lids are normal. No scleral icterus.       Right eye: No discharge.        Left eye: No discharge.     Conjunctiva/sclera: Conjunctivae normal.     Pupils: Pupils are equal, round, and reactive to light.  Neck:     Musculoskeletal: Normal range of motion and neck supple. No spinous process tenderness or muscular tenderness.     Thyroid : No thyromegaly.     Vascular: No JVD.     Trachea: No tracheal deviation.  Cardiovascular:  Rate and Rhythm: Normal rate and regular rhythm.     Pulses:          Dorsalis pedis pulses are 2+ on the right side and 2+ on the left side.       Posterior tibial pulses are 2+ on the right side and 2+ on the left side.     Heart sounds: Normal heart sounds, S1 normal and S2 normal. No murmur.  Pulmonary:     Effort: Pulmonary effort is normal. No accessory muscle usage or respiratory distress.     Breath sounds: Normal breath sounds. No decreased breath  sounds, wheezing, rhonchi or rales.  Chest:     Chest wall: No tenderness.  Abdominal:     General: Bowel sounds are normal. There is no distension.     Palpations: Abdomen is soft. There is no mass.     Tenderness: There is no abdominal tenderness. There is no guarding or rebound.     Hernia: No hernia is present.  Musculoskeletal: Normal range of motion.        General: No tenderness or deformity.  Lymphadenopathy:     Cervical: No cervical adenopathy.     Upper Body:     Right upper body: No supraclavicular adenopathy.     Left upper body: No supraclavicular adenopathy.  Skin:    General: Skin is warm and dry.     Coloration: Skin is not pale.     Findings: No erythema or rash.     Nails: There is no clubbing.   Neurological:     Mental Status: He is alert and oriented to person, place, and time.     Cranial Nerves: No cranial nerve deficit.     Sensory: No sensory deficit.     Motor: No tremor.     Coordination: Coordination normal.     Gait: Gait normal.     Deep Tendon Reflexes: Reflexes are normal and symmetric.     Reflex Scores:      Patellar reflexes are 2+ on the right side and 2+ on the left side. Psychiatric:        Speech: Speech normal.        Behavior: Behavior normal.        Thought Content: Thought content normal.        Judgment: Judgment normal.      Assessment/Plan:                                     Patient was seen for Diabetic Screening. Patient received counseling on blood sugar test results.   1. Screening for diabetes mellitus - POCT glucose - PREVENTATIVE MEDICINE INDIVIDUAL COUNSELING 0-15 MINUTES (99401)   Fasting blood glucose 105 mg/dl (patient reports that he is chewing sugar-free gum)

## 2019-09-15 DIAGNOSIS — E162 Hypoglycemia, unspecified: Secondary | ICD-10-CM | POA: Diagnosis not present

## 2019-09-27 DIAGNOSIS — E785 Hyperlipidemia, unspecified: Secondary | ICD-10-CM | POA: Diagnosis not present

## 2019-09-27 DIAGNOSIS — E7800 Pure hypercholesterolemia, unspecified: Secondary | ICD-10-CM | POA: Diagnosis not present

## 2019-09-30 DIAGNOSIS — E785 Hyperlipidemia, unspecified: Secondary | ICD-10-CM | POA: Diagnosis not present

## 2019-10-09 IMAGING — CT CT ABDOMEN AND PELVIS WITH CONTRAST
2 of 5 series · 15 of 46 positions shown, 17 images · IV contrast (APPLIED)
Comparison: None.

CLINICAL DATA: Abdominal pain and back pain

EXAM:
CT ABDOMEN AND PELVIS WITH CONTRAST
TECHNIQUE: Multidetector CT imaging of the abdomen and pelvis was performed
using the standard protocol following bolus administration of
intravenous contrast.
CONTRAST:  100mL OMNIPAQUE IOHEXOL 300 MG/ML  SOLN

[Series 2: axial st · axial · 0.88mm/px · z∈[-548,-68]mm · 12 of 108 slices shown, 14 images]
[im 6/108  soft-tissue]
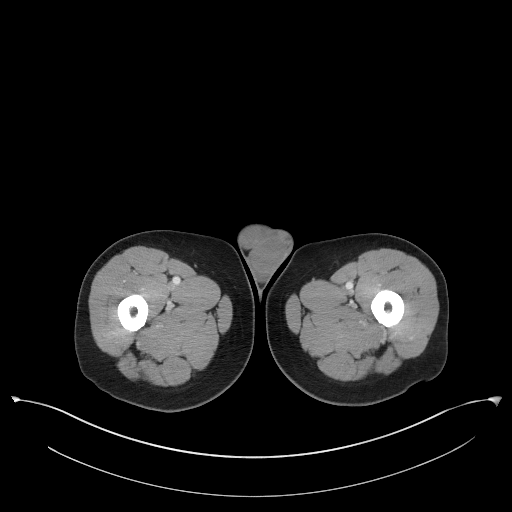
[im 6/108  bone]
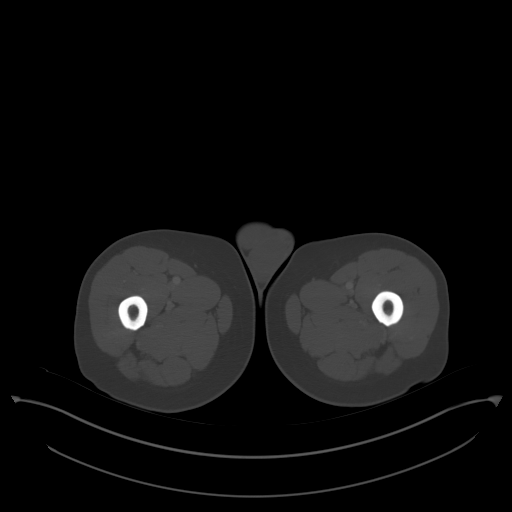
[im 16/108  soft-tissue]
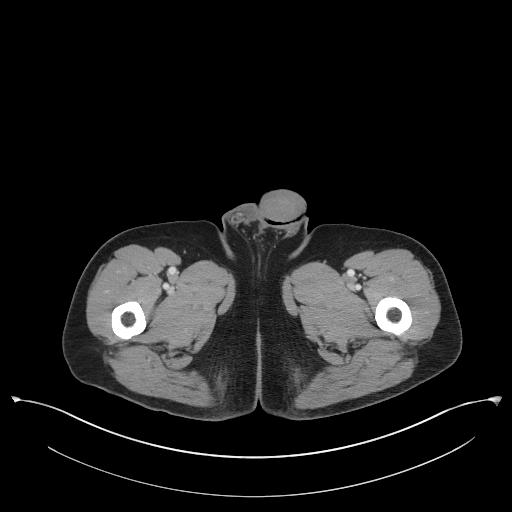
[im 26/108  soft-tissue]
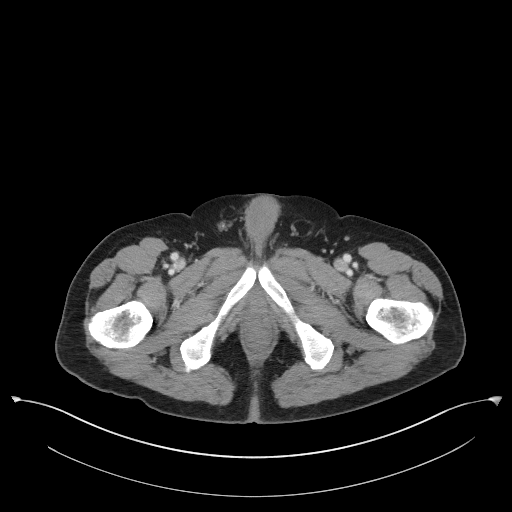
[im 31/108  soft-tissue]
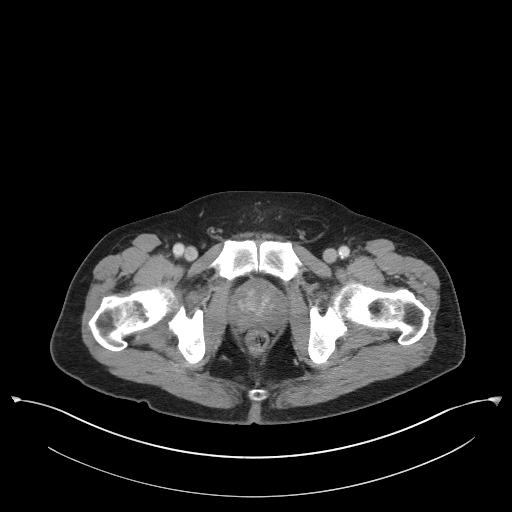
[im 41/108  soft-tissue]
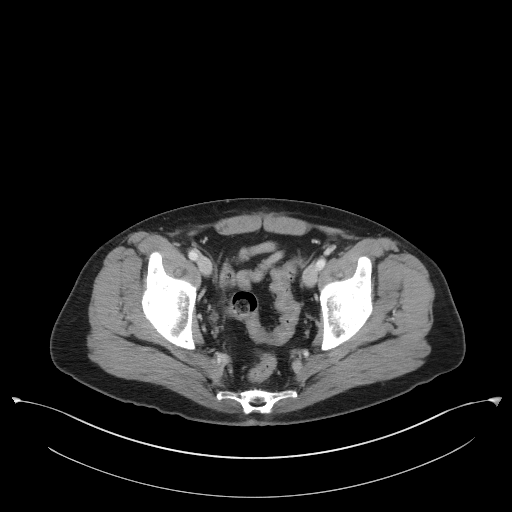
[im 51/108  soft-tissue]
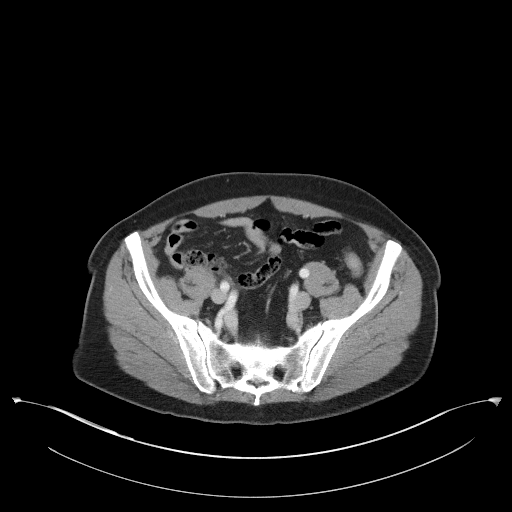
[im 57/108  soft-tissue]
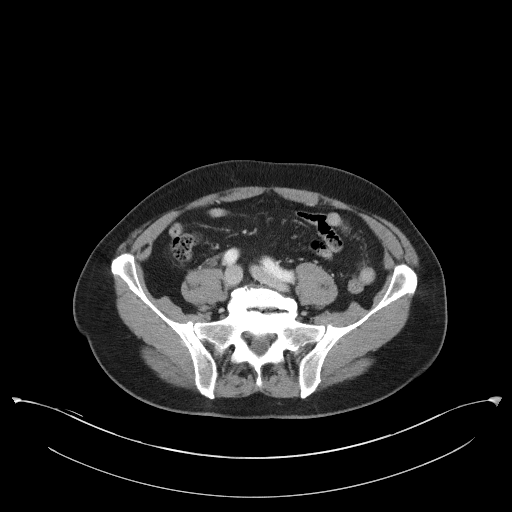
[im 67/108  soft-tissue]
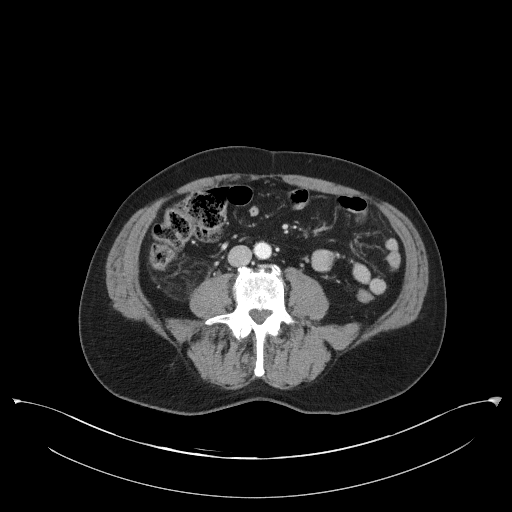
[im 77/108  soft-tissue]
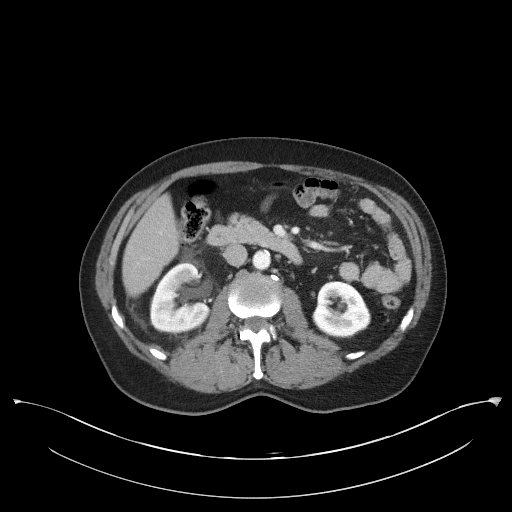
[im 77/108  bone]
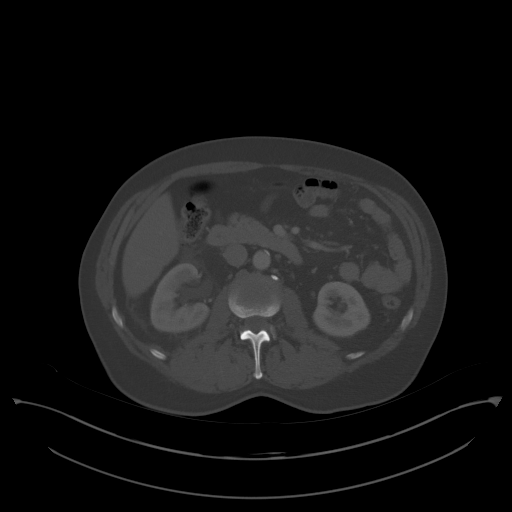
[im 82/108  soft-tissue]
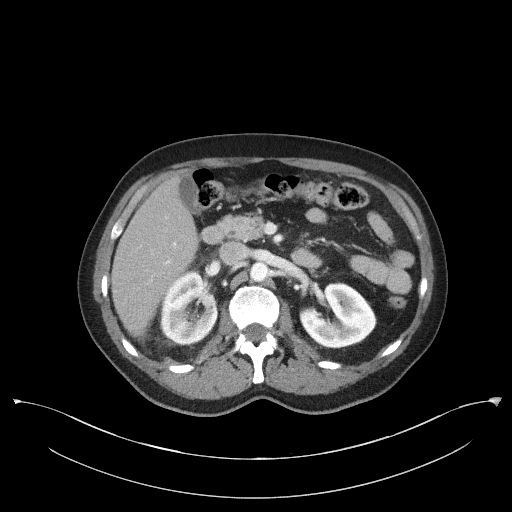
[im 92/108  soft-tissue]
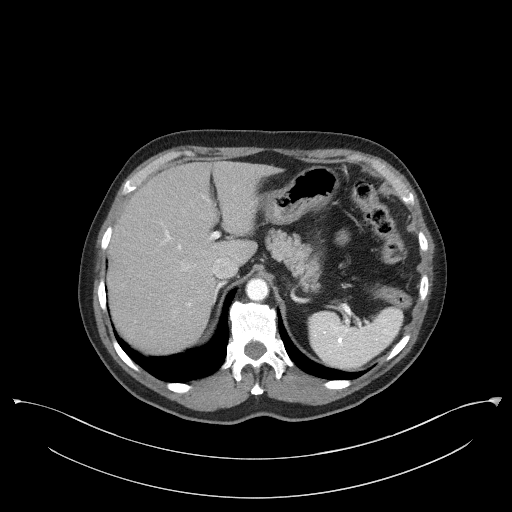
[im 102/108  soft-tissue]
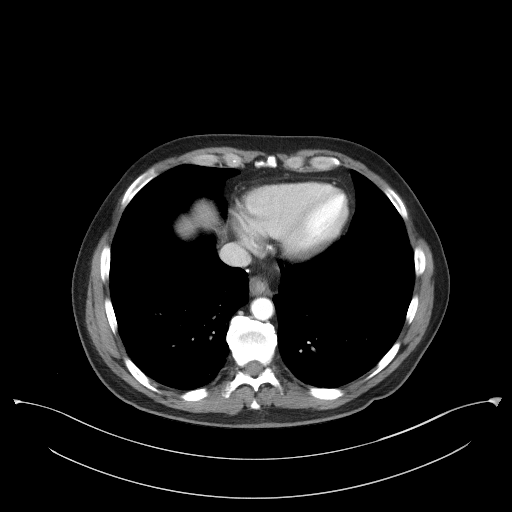

[Series 5: coronal st · coronal · 0.75mm/px · 3 of 88 slices shown]
[im 30/88  soft-tissue]
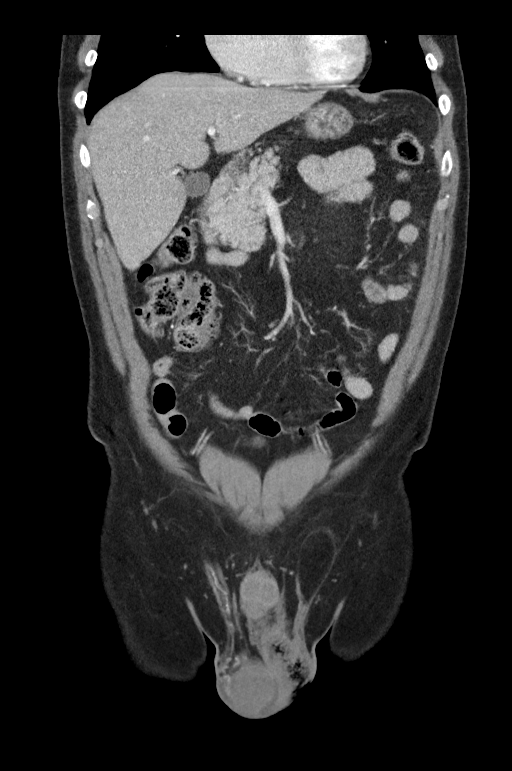
[im 39/88  soft-tissue]
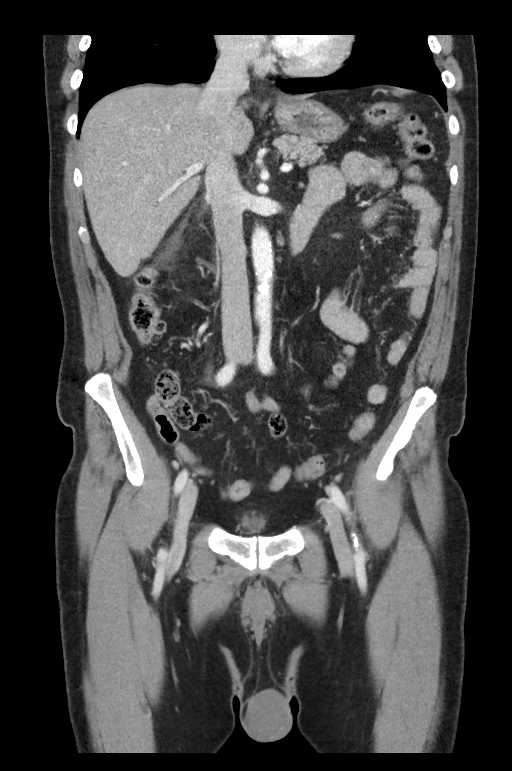
[im 49/88  soft-tissue]
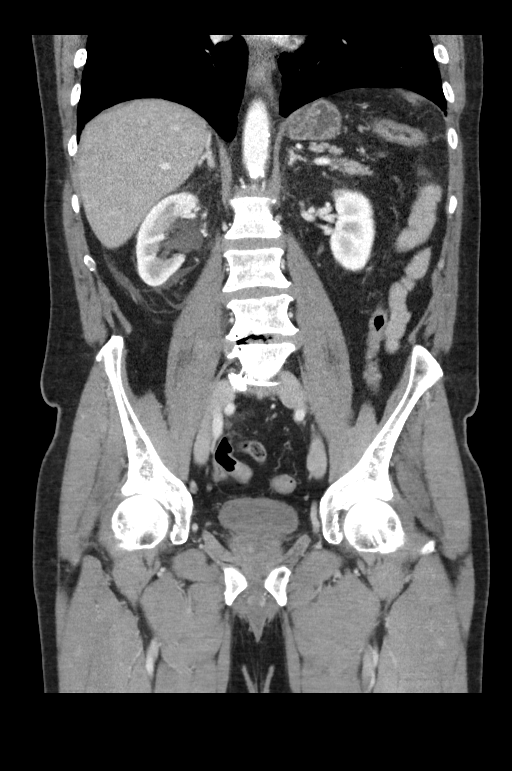

[15 of 46 positions shown; findings below may reference images not displayed]

FINDINGS: Lower chest: No acute abnormality.

Hepatobiliary: No focal liver abnormality is seen. No gallstones,
gallbladder wall thickening, or biliary dilatation.

Pancreas: Unremarkable. No pancreatic ductal dilatation or
surrounding inflammatory changes.

Spleen: Calcifications throughout the spleen likely reflecting
sequela prior granulomatous disease.

Adrenals/Urinary Tract: Normal adrenal glands. 2 mm right UVJ
calculus resulting in moderate right hydroureteronephrosis and
perinephric stranding. No left urolithiasis or obstructive uropathy.
Bladder is normal.

Stomach/Bowel: Stomach is within normal limits. Appendix appears
normal. No evidence of bowel wall thickening, distention, or
inflammatory changes.

Vascular/Lymphatic: Normal caliber abdominal aorta with mild
atherosclerosis. No lymphadenopathy.

Reproductive: Enlarged prostate gland.

Other: No abdominal wall hernia or abnormality. No abdominopelvic
ascites.

Musculoskeletal: Degenerative disc disease with disc height loss at
L4-5 and L5-S1 with bilateral facet arthropathy.
IMPRESSION: 1. 2 mm right UVJ calculus resulting in moderate right
hydroureteronephrosis and perinephric stranding.

## 2019-12-26 ENCOUNTER — Ambulatory Visit: Payer: PPO | Attending: Internal Medicine

## 2019-12-26 DIAGNOSIS — Z23 Encounter for immunization: Secondary | ICD-10-CM | POA: Insufficient documentation

## 2019-12-26 NOTE — Progress Notes (Signed)
   Covid-19 Vaccination Clinic  Name:  [name]    MRN: [id number] DOB: Feb 12, 1941  12/26/2019  Mr. Nicholas Neal was observed post Covid-19 immunization for 15 minutes without incidence. He was provided with Vaccine Information Sheet and instruction to access the V-Safe system.   Mr. Nicholas Neal was instructed to call 911 with any severe reactions post vaccine: Marland Kitchen Difficulty breathing  . Swelling of your face and throat  . A fast heartbeat  . A bad rash all over your body  . Dizziness and weakness    Immunizations Administered    Name Date Dose VIS Date Route   Pfizer COVID-19 Vaccine 12/26/2019  3:01 PM 0.3 mL 10/29/2019 Intramuscular   Manufacturer: [place]   Lot: [id number]   [place]: [id number]

## 2019-12-30 DIAGNOSIS — Z131 Encounter for screening for diabetes mellitus: Secondary | ICD-10-CM | POA: Diagnosis not present

## 2019-12-30 DIAGNOSIS — E7800 Pure hypercholesterolemia, unspecified: Secondary | ICD-10-CM | POA: Diagnosis not present

## 2020-01-10 ENCOUNTER — Ambulatory Visit: Payer: PPO

## 2020-01-20 ENCOUNTER — Ambulatory Visit: Payer: PPO | Attending: Internal Medicine

## 2020-01-20 DIAGNOSIS — Z23 Encounter for immunization: Secondary | ICD-10-CM | POA: Insufficient documentation

## 2020-01-20 NOTE — Progress Notes (Signed)
   Covid-19 Vaccination Clinic  Name:  Nicholas Neal    MRN: [id number] DOB: [birth year]  01/20/2020  [name] was observed post Covid-19 immunization for 15 minutes without incident. He was provided with Vaccine Information Sheet and instruction to access the V-Safe system.   [name] was instructed to call 911 with any severe reactions post vaccine: Marland Kitchen Difficulty breathing  . Swelling of face and throat  . A fast heartbeat  . A bad rash all over body  . Dizziness and weakness   Immunizations Administered    Name Date Dose VIS Date Route   Pfizer COVID-19 Vaccine 01/20/2020  9:00 AM 0.3 mL 10/29/2019 Intramuscular   Manufacturer: [place]   Lot: [id number]   [place]: [id number]

## 2020-02-04 DIAGNOSIS — R5383 Other fatigue: Secondary | ICD-10-CM | POA: Diagnosis not present

## 2020-02-04 DIAGNOSIS — F322 Major depressive disorder, single episode, severe without psychotic features: Secondary | ICD-10-CM | POA: Diagnosis not present

## 2020-04-05 DIAGNOSIS — L989 Disorder of the skin and subcutaneous tissue, unspecified: Secondary | ICD-10-CM | POA: Diagnosis not present

## 2020-04-05 DIAGNOSIS — F322 Major depressive disorder, single episode, severe without psychotic features: Secondary | ICD-10-CM | POA: Diagnosis not present

## 2020-04-12 DIAGNOSIS — C44519 Basal cell carcinoma of skin of other part of trunk: Secondary | ICD-10-CM | POA: Diagnosis not present

## 2020-04-12 DIAGNOSIS — L570 Actinic keratosis: Secondary | ICD-10-CM | POA: Diagnosis not present

## 2020-04-12 DIAGNOSIS — D485 Neoplasm of uncertain behavior of skin: Secondary | ICD-10-CM | POA: Diagnosis not present

## 2020-04-12 DIAGNOSIS — L821 Other seborrheic keratosis: Secondary | ICD-10-CM | POA: Diagnosis not present

## 2020-09-18 DIAGNOSIS — S46911A Strain of unspecified muscle, fascia and tendon at shoulder and upper arm level, right arm, initial encounter: Secondary | ICD-10-CM | POA: Diagnosis not present

## 2020-09-18 DIAGNOSIS — M25521 Pain in right elbow: Secondary | ICD-10-CM | POA: Diagnosis not present

## 2020-09-18 DIAGNOSIS — S5001XA Contusion of right elbow, initial encounter: Secondary | ICD-10-CM | POA: Diagnosis not present

## 2020-09-25 DIAGNOSIS — S46011A Strain of muscle(s) and tendon(s) of the rotator cuff of right shoulder, initial encounter: Secondary | ICD-10-CM | POA: Diagnosis not present

## 2020-09-28 DIAGNOSIS — S46011A Strain of muscle(s) and tendon(s) of the rotator cuff of right shoulder, initial encounter: Secondary | ICD-10-CM | POA: Diagnosis not present

## 2020-10-02 DIAGNOSIS — S46011A Strain of muscle(s) and tendon(s) of the rotator cuff of right shoulder, initial encounter: Secondary | ICD-10-CM | POA: Diagnosis not present

## 2020-10-05 DIAGNOSIS — F322 Major depressive disorder, single episode, severe without psychotic features: Secondary | ICD-10-CM | POA: Diagnosis not present

## 2020-10-05 DIAGNOSIS — E7800 Pure hypercholesterolemia, unspecified: Secondary | ICD-10-CM | POA: Diagnosis not present

## 2020-10-05 DIAGNOSIS — Z0000 Encounter for general adult medical examination without abnormal findings: Secondary | ICD-10-CM | POA: Diagnosis not present

## 2020-10-05 DIAGNOSIS — S46011A Strain of muscle(s) and tendon(s) of the rotator cuff of right shoulder, initial encounter: Secondary | ICD-10-CM | POA: Diagnosis not present

## 2020-10-09 DIAGNOSIS — S46011A Strain of muscle(s) and tendon(s) of the rotator cuff of right shoulder, initial encounter: Secondary | ICD-10-CM | POA: Diagnosis not present

## 2020-10-16 DIAGNOSIS — S46011A Strain of muscle(s) and tendon(s) of the rotator cuff of right shoulder, initial encounter: Secondary | ICD-10-CM | POA: Diagnosis not present

## 2020-10-18 DIAGNOSIS — L820 Inflamed seborrheic keratosis: Secondary | ICD-10-CM | POA: Diagnosis not present

## 2020-10-18 DIAGNOSIS — Z85828 Personal history of other malignant neoplasm of skin: Secondary | ICD-10-CM | POA: Diagnosis not present

## 2020-10-18 DIAGNOSIS — L821 Other seborrheic keratosis: Secondary | ICD-10-CM | POA: Diagnosis not present

## 2020-10-19 DIAGNOSIS — S46011A Strain of muscle(s) and tendon(s) of the rotator cuff of right shoulder, initial encounter: Secondary | ICD-10-CM | POA: Diagnosis not present

## 2020-10-24 DIAGNOSIS — S46011A Strain of muscle(s) and tendon(s) of the rotator cuff of right shoulder, initial encounter: Secondary | ICD-10-CM | POA: Diagnosis not present

## 2020-10-26 DIAGNOSIS — S46011A Strain of muscle(s) and tendon(s) of the rotator cuff of right shoulder, initial encounter: Secondary | ICD-10-CM | POA: Diagnosis not present

## 2021-04-05 DIAGNOSIS — E7800 Pure hypercholesterolemia, unspecified: Secondary | ICD-10-CM | POA: Diagnosis not present

## 2021-04-05 DIAGNOSIS — F322 Major depressive disorder, single episode, severe without psychotic features: Secondary | ICD-10-CM | POA: Diagnosis not present

## 2021-04-05 DIAGNOSIS — Z79899 Other long term (current) drug therapy: Secondary | ICD-10-CM | POA: Diagnosis not present

## 2021-04-05 DIAGNOSIS — Z131 Encounter for screening for diabetes mellitus: Secondary | ICD-10-CM | POA: Diagnosis not present

## 2021-07-30 DIAGNOSIS — M25511 Pain in right shoulder: Secondary | ICD-10-CM | POA: Diagnosis not present

## 2021-07-30 DIAGNOSIS — M75111 Incomplete rotator cuff tear or rupture of right shoulder, not specified as traumatic: Secondary | ICD-10-CM | POA: Diagnosis not present

## 2021-09-03 DIAGNOSIS — M7541 Impingement syndrome of right shoulder: Secondary | ICD-10-CM | POA: Diagnosis not present

## 2021-09-06 DIAGNOSIS — M7541 Impingement syndrome of right shoulder: Secondary | ICD-10-CM | POA: Diagnosis not present

## 2021-09-10 DIAGNOSIS — M7541 Impingement syndrome of right shoulder: Secondary | ICD-10-CM | POA: Diagnosis not present

## 2021-09-13 DIAGNOSIS — M7541 Impingement syndrome of right shoulder: Secondary | ICD-10-CM | POA: Diagnosis not present

## 2021-09-17 DIAGNOSIS — M7541 Impingement syndrome of right shoulder: Secondary | ICD-10-CM | POA: Diagnosis not present

## 2021-09-20 DIAGNOSIS — M7541 Impingement syndrome of right shoulder: Secondary | ICD-10-CM | POA: Diagnosis not present

## 2021-10-01 DIAGNOSIS — M25511 Pain in right shoulder: Secondary | ICD-10-CM | POA: Diagnosis not present

## 2021-10-25 DIAGNOSIS — L570 Actinic keratosis: Secondary | ICD-10-CM | POA: Diagnosis not present

## 2021-10-25 DIAGNOSIS — L218 Other seborrheic dermatitis: Secondary | ICD-10-CM | POA: Diagnosis not present

## 2021-10-25 DIAGNOSIS — Z85828 Personal history of other malignant neoplasm of skin: Secondary | ICD-10-CM | POA: Diagnosis not present

## 2021-10-25 DIAGNOSIS — L821 Other seborrheic keratosis: Secondary | ICD-10-CM | POA: Diagnosis not present

## 2021-10-25 DIAGNOSIS — B353 Tinea pedis: Secondary | ICD-10-CM | POA: Diagnosis not present

## 2024-01-26 DIAGNOSIS — E119 Type 2 diabetes mellitus without complications: Secondary | ICD-10-CM | POA: Diagnosis not present

## 2024-01-29 ENCOUNTER — Encounter: Payer: Self-pay | Admitting: Physician Assistant

## 2024-02-23 DIAGNOSIS — E119 Type 2 diabetes mellitus without complications: Secondary | ICD-10-CM | POA: Diagnosis not present

## 2024-03-13 NOTE — Progress Notes (Signed)
 Assessment/Plan:     Nicholas Neal is a very pleasant 83 y.o. 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 MoCA today is 17/30, etiology unclear.  Workup is in progress. Patient is able to participate on ADLs and to drive . Mood is good.    Memory Impairment of unclear etiology   MRI brain without contrast to assess for underlying structural abnormality and assess vascular load  Neurocognitive testing to further evaluate cognitive concerns and determine other underlying cause of memory changes, including potential contribution from sleep, anxiety, attention, or depression among others  Check B12, TSH, B1 Recommend good control of cardiovascular risk factors.   Continue to control mood as per PCP Monitor driving [place] up in 3-4 months   Subjective:    The patient is accompanied by his wife  who supplement  the history.    How long did patient have memory difficulties?  For about "long time, 15 years"-I used to call everyone by their names or recite poems and now I can't.  Patient reports some difficulty remembering new information, recent conversations, activities, names. "Always had word finding problems so I have never been worried, and I never been able to remember content in music, lyrics, but lately  I am having issues with names of people such as my friend Myrtie Atkinson and [employer]", "and he did not recognize the name of the restaurant"wife adds. He likes to read, not as much as before. He used to like reading novels but "I distract myself instead with other things". Likes to build furniture "with precision". repeats oneself?  Endorsed, for the last 3 years, "15 mins later he may ask again".  Disoriented when walking into a room?  Patient denies    Leaving objects in unusual places?  Denies.   Wandering behavior? Denies.   Any personality changes, or depression, anxiety? Denies   Hallucinations or paranoia? Denies.   Seizures? Denies.    Any sleep changes?  Sleeps well. Denies any frequent nightmares or dream reenactment, other REM behavior or sleepwalking   Sleep apnea? He reports that he takes a nap in the morning and then another in the afternoon. HE may have a sleep evaluation in the future Any hygiene concerns?  Denies.   Independent of bathing and dressing? Endorsed  Does the patient need help with medications? Patient is in charge   Who is in charge of the finances? Wife has always been in charge     Any changes in appetite?  Lately he is more careful with food, especially protein.  Has seen a nutritionist recently.  Pushing water Patient have trouble swallowing?  Denies.   Does the patient cook? No  Any headaches?  Denies.   Chronic pain? Denies.   Ambulates with difficulty? Denies. Walks frequently "really fast in the park" Recent falls or head injuries? Denies.     Vision changes?  Denies any new issues.  Has a history of cataracts removed Any strokelike symptoms? Denies.   Any tremors? Denies.    Any anosmia?"I don't smell much at all". Any incontinence of urine? Some frequency.   Any bowel dysfunction? Denies.      Patient lives with wife.    History of heavy alcohol intake?1-2 glasses a day.   History of heavy tobacco use? Denies.   Family history of dementia? Father had AD dementia  Does patient drive? Yes, denies getting lost but" cannot remember the  streets anymore, I forgot the streets".   Retired Airline pilot     No Known Allergies  Current Outpatient Medications  Medication Instructions   atorvastatin (LIPITOR) 20 mg, Daily   cephALEXin  (KEFLEX ) 500 mg, 4 times daily   escitalopram (LEXAPRO) 20 mg, Daily     VITALS:   Vitals:   03/15/24 1336  BP: 133/64  Pulse: (!) 56  SpO2: 93%  Weight: 162 lb (73.5 kg)  Height: 5\' 9"  (1.753 m)      PHYSICAL EXAM   HEENT:  Normocephalic, atraumatic.  The superficial temporal  arteries are without ropiness or tenderness. Cardiovascular: Regular rate and rhythm. Lungs: Clear to auscultation bilaterally. Neck: There are no carotid bruits noted bilaterally.  NEUROLOGICAL:    03/15/2024    1:00 PM  Montreal Cognitive Assessment   Visuospatial/ Executive (0/5) 2  Naming (0/3) 2  Attention: Read list of digits (0/2) 2  Attention: Read list of letters (0/1) 1  Attention: Serial 7 subtraction starting at 100 (0/3) 3  Language: Repeat phrase (0/2) 2  Language : Fluency (0/1) 0  Abstraction (0/2) 2  Delayed Recall (0/5) 0  Orientation (0/6) 3  Total 17        No data to display           Orientation:  Alert and oriented to person, place and not to time. No aphasia or dysarthria. Fund of knowledge is appropriate. Recent and remote memory impaired.  Attention and concentration are reduced .  Able to name objects and repeat phrases.   Delayed recall 0 /5 .  Cranial nerves: There is good facial symmetry. Extraocular muscles are intact and visual fields are full to confrontational testing. Speech is fluent and clear. No tongue deviation. Hearing is intact to conversational tone.  Tone: Tone is good throughout. Sensation: Sensation is intact to light touch.  Vibration is intact at the bilateral big toe.  Coordination: The patient has no difficulty with RAM's or FNF bilaterally. Normal finger to nose  Motor: Strength is 5/5 in the bilateral upper and lower extremities. There is no pronator drift. There are no fasciculations noted. DTR's: Deep tendon reflexes are 2/4 bilaterally. Gait and Station: The patient is able to ambulate without difficulty. Gait is cautious and narrow. Stride length is normal.        Thank you for allowing us  the opportunity to participate in the care of this nice patient. Please do not hesitate to contact us  for any questions or concerns.   Total time spent on today's visit was 45 minutes dedicated to this patient today, preparing to see  patient, examining the patient, ordering tests and/or medications and counseling the patient, documenting clinical information in the EHR or other health record, independently interpreting results and communicating results to the patient/family, discussing treatment and goals, answering patient's questions and coordinating care.  Cc:  Ruven Coy, MD  Tex Filbert 03/15/2024 5:05 PM

## 2024-03-15 ENCOUNTER — Ambulatory Visit

## 2024-03-15 ENCOUNTER — Other Ambulatory Visit

## 2024-03-15 ENCOUNTER — Encounter: Payer: Self-pay | Admitting: Physician Assistant

## 2024-03-15 ENCOUNTER — Ambulatory Visit: Admitting: Physician Assistant

## 2024-03-15 VITALS — BP 133/64 | HR 56 | Ht 69.0 in | Wt 162.0 lb

## 2024-03-15 DIAGNOSIS — R413 Other amnesia: Secondary | ICD-10-CM | POA: Diagnosis not present

## 2024-03-15 NOTE — Patient Instructions (Addendum)
 It was a pleasure to see you today at our office.   Recommendations:  Neurocognitive evaluation at our office   MRI of the brain, the radiology office will call you to arrange you appointment   Check labs today   Follow up 4 months    For psychiatric meds, mood meds: Please have your primary care physician manage these medications.  If you have any severe symptoms of a stroke, or other severe issues such as confusion,severe chills or fever, etc call 911 or go to the ER as you may need to be evaluated further   For guidance regarding [facility] and if placement were needed at the facility, contact Social Worker tel: [phone]  For assessment of decision of mental capacity and competency:  Call Dr. Laverne Potter, geriatric psychiatrist at [phone]  Counseling regarding caregiver distress, including caregiver depression, anxiety and issues regarding community resources, adult day care programs, adult living facilities, or memory care questions:  please contact your  Primary Doctor's Social Worker   Whom to call: Memory  decline, memory medications: Call our office [phone]    [email or web address]   RECOMMENDATIONS FOR ALL PATIENTS WITH MEMORY PROBLEMS: 1. Continue to exercise (Recommend 30 minutes of walking everyday, or 3 hours every week) 2. Increase social interactions - continue going to [place] and enjoy social gatherings with friends and family 3. Eat healthy, avoid fried foods and eat more fruits and vegetables 4. Maintain adequate blood pressure, blood sugar, and blood cholesterol level. Reducing the risk of stroke and cardiovascular disease also helps promoting better memory. 5. Avoid stressful situations. Live a simple life and avoid aggravations. Organize your time and prepare for the next day in anticipation. 6. Sleep well, avoid any interruptions of sleep and avoid any distractions in the  bedroom that may interfere with adequate sleep quality 7. Avoid sugar, avoid sweets as there is a strong link between excessive sugar intake, diabetes, and cognitive impairment We discussed the Mediterranean diet, which has been shown to help patients reduce the risk of progressive memory disorders and reduces cardiovascular risk. This includes eating fish, eat fruits and green leafy vegetables, nuts like almonds and hazelnuts, walnuts, and also use olive oil. Avoid fast foods and fried foods as much as possible. Avoid sweets and sugar as sugar use has been linked to worsening of memory function.  There is always a concern of gradual progression of memory problems. If this is the case, then we may need to adjust level of care according to patient needs. Support, both to the patient and caregiver, should then be put into place.      You have been referred for a neuropsychological evaluation (i.e., evaluation of memory and thinking abilities). Please bring someone with you to this appointment if possible, as it is helpful for the doctor to hear from both you and another adult who knows you well. Please bring eyeglasses and hearing aids if you wear them.    The evaluation will take approximately 3 hours and has two parts:   The first part is a clinical interview with the neuropsychologist (Dr. Kitty Perkins or Dr. Donavon Fudge). During the interview, the neuropsychologist will speak with you and the individual you brought to the appointment.    The second part of the evaluation is testing with the doctor's technician Bernabe Brew or Burdette Carolin). During the testing, the technician will ask you to remember different types of material, solve problems, and answer some questionnaires. Your family member will not be present for  this portion of the evaluation.   Please note: We must reserve several hours of the neuropsychologist's time and the psychometrician's time for your evaluation appointment. As such, there is a No-Show fee of  $100. If you are unable to attend any of your appointments, please contact our office as soon as possible to reschedule.      DRIVING: Regarding driving, in patients with progressive memory problems, driving will be impaired. We advise to have someone else do the driving if trouble finding directions or if minor accidents are reported. Independent driving assessment is available to determine safety of driving.   If you are interested in the driving assessment, you can contact the following:  [facility] [employer] [facility] in [place] [phone]  [facility] [phone]  [facility] [phone]  [facility] [phone] or [phone]   FALL PRECAUTIONS: Be cautious when walking. Scan the area for obstacles that may increase the risk of trips and falls. When getting up in the mornings, sit up at the edge of the bed for a few minutes before getting out of bed. Consider elevating the bed at the head end to avoid drop of blood pressure when getting up. Walk always in a well-lit room (use night lights in the walls). Avoid area rugs or power cords from appliances in the middle of the walkways. Use a walker or a cane if necessary and consider physical therapy for balance exercise. Get your eyesight checked regularly.  FINANCIAL OVERSIGHT: Supervision, especially oversight when making financial decisions or transactions is also recommended.  HOME SAFETY: Consider the safety of the kitchen when operating appliances like stoves, microwave oven, and blender. Consider having supervision and share cooking responsibilities until no longer able to participate in those. Accidents with firearms and other hazards in the house should be identified and addressed as well.   ABILITY TO BE LEFT ALONE: If patient is unable to contact 911 operator, consider using LifeLine, or when the need is there, arrange for someone to stay with patients. Smoking is a fire hazard, consider  supervision or cessation. Risk of wandering should be assessed by caregiver and if detected at any point, supervision and safe proof recommendations should be instituted.  MEDICATION SUPERVISION: Inability to self-administer medication needs to be constantly addressed. Implement a mechanism to ensure safe administration of the medications.      Mediterranean Diet A Mediterranean diet refers to food and lifestyle choices that are based on the traditions of countries located on the [place]. This way of eating has been shown to help prevent certain conditions and improve outcomes for people who have chronic diseases, like kidney disease and heart disease. What are tips for following this plan? Lifestyle  Cook and eat meals together with your family, when possible. Drink enough fluid to keep your urine clear or pale yellow. Be physically active every day. This includes: Aerobic exercise like running or swimming. Leisure activities like gardening, walking, or housework. Get 7-8 hours of sleep each night. If recommended by your health care provider, drink red wine in moderation. This means 1 glass a day for nonpregnant women and 2 glasses a day for men. A glass of wine equals 5 oz (150 mL). Reading food labels  Check the serving size of packaged foods. For foods such as rice and pasta, the serving size refers to the amount of cooked product, not dry. Check the total fat in packaged foods. Avoid foods that have saturated fat or trans fats. Check the ingredients list for  added sugars, such as corn syrup. Shopping  At the grocery store, buy most of your food from the areas near the walls of the store. This includes: Fresh fruits and vegetables (produce). Grains, beans, nuts, and seeds. Some of these may be available in unpackaged forms or large amounts (in bulk). Fresh seafood. Poultry and eggs. Low-fat dairy products. Buy whole ingredients instead of prepackaged foods. Buy fresh  fruits and vegetables in-season from local farmers markets. Buy frozen fruits and vegetables in resealable bags. If you do not have access to quality fresh seafood, buy precooked frozen shrimp or canned fish, such as tuna, salmon, or sardines. Buy small amounts of raw or cooked vegetables, salads, or olives from the deli or salad bar at your store. Stock your pantry so you always have certain foods on hand, such as olive oil, canned tuna, canned tomatoes, rice, pasta, and beans. Cooking  Cook foods with extra-virgin olive oil instead of using butter or other vegetable oils. Have meat as a side dish, and have vegetables or grains as your main dish. This means having meat in small portions or adding small amounts of meat to foods like pasta or stew. Use beans or vegetables instead of meat in common dishes like chili or lasagna. Experiment with different cooking methods. Try roasting or broiling vegetables instead of steaming or sauteing them. Add frozen vegetables to soups, stews, pasta, or rice. Add nuts or seeds for added healthy fat at each meal. You can add these to yogurt, salads, or vegetable dishes. Marinate fish or vegetables using olive oil, lemon juice, garlic, and fresh herbs. Meal planning  Plan to eat 1 vegetarian meal one day each week. Try to work up to 2 vegetarian meals, if possible. Eat seafood 2 or more times a week. Have healthy snacks readily available, such as: Vegetable sticks with hummus. Greek yogurt. Fruit and nut trail mix. Eat balanced meals throughout the week. This includes: Fruit: 2-3 servings a day Vegetables: 4-5 servings a day Low-fat dairy: 2 servings a day Fish, poultry, or lean meat: 1 serving a day Beans and legumes: 2 or more servings a week Nuts and seeds: 1-2 servings a day Whole grains: 6-8 servings a day Extra-virgin olive oil: 3-4 servings a day Limit red meat and sweets to only a few servings a month What are my food choices? Mediterranean  diet Recommended Grains: Whole-grain pasta. Brown rice. Bulgar wheat. Polenta. Couscous. Whole-wheat bread. Dwyane Glad. Vegetables: Artichokes. Beets. Broccoli. Cabbage. Carrots. Eggplant. Green beans. Chard. Kale. Spinach. Onions. Leeks. Peas. Squash. Tomatoes. Peppers. Radishes. Fruits: Apples. Apricots. Avocado. Berries. Bananas. Cherries. Dates. Figs. Grapes. Lemons. Melon. Oranges. Peaches. Plums. Pomegranate. Meats and other protein foods: Beans. Almonds. Sunflower seeds. Pine nuts. Peanuts. Cod. Salmon. Scallops. Shrimp. Tuna. Tilapia. Clams. Oysters. Eggs. Dairy: Low-fat milk. Cheese. Greek yogurt. Beverages: Water. Red wine. Herbal tea. Fats and oils: Extra virgin olive oil. Avocado oil. Grape seed oil. Sweets and desserts: [place] yogurt with honey. Baked apples. Poached pears. Trail mix. Seasoning and other foods: Basil. Cilantro. Coriander. Cumin. Mint. Parsley. Sage. Rosemary. Tarragon. Garlic. Oregano. Thyme. Pepper. Balsalmic vinegar. Tahini. Hummus. Tomato sauce. Olives. Mushrooms. Limit these Grains: Prepackaged pasta or rice dishes. Prepackaged cereal with added sugar. Vegetables: Deep fried potatoes (french fries). Fruits: Fruit canned in syrup. Meats and other protein foods: Beef. Pork. Lamb. Poultry with skin. Hot dogs. Helene Loader. Dairy: Ice cream. Sour cream. Whole milk. Beverages: Juice. Sugar-sweetened soft drinks. Beer. Liquor and spirits. Fats and oils: Butter. Canola oil. Vegetable oil.  Beef fat (tallow). Lard. Sweets and desserts: Cookies. Cakes. Pies. Candy. Seasoning and other foods: Mayonnaise. Premade sauces and marinades. The items listed may not be a complete list. Talk with your dietitian about what dietary choices are right for you. Summary The Mediterranean diet includes both food and lifestyle choices. Eat a variety of fresh fruits and vegetables, beans, nuts, seeds, and whole grains. Limit the amount of red meat and sweets that you eat. Talk with your  health care provider about whether it is safe for you to drink red wine in moderation. This means 1 glass a day for nonpregnant women and 2 glasses a day for men. A glass of wine equals 5 oz (150 mL). This information is not intended to replace advice given to you by your health care provider. Make sure you discuss any questions you have with your health care provider. Document Released: 06/27/2016 Document Revised: 07/30/2016 Document Reviewed: 06/27/2016 Elsevier Interactive Patient Education  2017 [employer].

## 2024-03-16 NOTE — Progress Notes (Signed)
 B12 on the lower normal, recommend 1000 micrograms daily, follow with primary doctor. Thyroid  level is normal, B1 is pending, thanks

## 2024-03-17 NOTE — Progress Notes (Signed)
 Tried calling-no answer.

## 2024-03-18 NOTE — Progress Notes (Signed)
 Patient advised.

## 2024-03-19 LAB — TSH: TSH: 1.3 m[IU]/L (ref 0.40–4.50)

## 2024-03-19 LAB — VITAMIN B12: Vitamin B-12: 410 pg/mL (ref 200–1100)

## 2024-03-19 LAB — VITAMIN B1: Vitamin B1 (Thiamine): 10 nmol/L (ref 8–30)

## 2024-03-19 NOTE — Progress Notes (Signed)
 No answer at 2:17pm 03/19/2024

## 2024-03-19 NOTE — Progress Notes (Signed)
 B1 on the lower side, take 100 mg daily.  B12 and Thyroid  levels are normal.Thanks B12 is on the lower normal, recommend starting B12 1000 mcg daily and follow-up with your primary doctor, thyroid  levels are normal.  Thank you

## 2024-03-31 ENCOUNTER — Ambulatory Visit
Admission: RE | Admit: 2024-03-31 | Discharge: 2024-03-31 | Disposition: A | Discharge status: Home / Self Care | Source: Ambulatory Visit | Attending: Physician Assistant | Admitting: Physician Assistant

## 2024-03-31 DIAGNOSIS — R413 Other amnesia: Secondary | ICD-10-CM

## 2024-03-31 DIAGNOSIS — G319 Degenerative disease of nervous system, unspecified: Secondary | ICD-10-CM | POA: Diagnosis not present

## 2024-03-31 DIAGNOSIS — I6782 Cerebral ischemia: Secondary | ICD-10-CM | POA: Diagnosis not present

## 2024-04-01 ENCOUNTER — Ambulatory Visit: Payer: Self-pay | Admitting: Physician Assistant

## 2024-05-12 DIAGNOSIS — E119 Type 2 diabetes mellitus without complications: Secondary | ICD-10-CM | POA: Diagnosis not present

## 2024-06-28 DIAGNOSIS — Z85828 Personal history of other malignant neoplasm of skin: Secondary | ICD-10-CM | POA: Diagnosis not present

## 2024-06-28 DIAGNOSIS — L910 Hypertrophic scar: Secondary | ICD-10-CM | POA: Diagnosis not present

## 2024-07-14 ENCOUNTER — Ambulatory Visit: Payer: Self-pay

## 2024-07-14 ENCOUNTER — Ambulatory Visit: Admitting: Psychology

## 2024-07-14 DIAGNOSIS — G3184 Mild cognitive impairment, so stated: Secondary | ICD-10-CM

## 2024-07-14 DIAGNOSIS — R4189 Other symptoms and signs involving cognitive functions and awareness: Secondary | ICD-10-CM

## 2024-07-14 NOTE — Progress Notes (Signed)
   Psychometrician Note   Cognitive testing was administered to Nicholas Neal by Luke Pitcher, B.S. (psychometrist) under the supervision of Dr. Renda Beckwith, Psy.D., licensed psychologist on 07/14/2024. [name] did not appear overtly distressed by the testing session per behavioral observation or responses across self-report questionnaires. Rest breaks were offered.    The battery of tests administered was selected by Dr. Renda Beckwith, Psy.D. with consideration to [name]'s current level of functioning, the nature of his symptoms, emotional and behavioral responses during interview, level of literacy, observed level of motivation/effort, and the nature of the referral question. This battery was communicated to the psychometrist. Communication between Dr. Renda Beckwith, Psy.D. and the psychometrist was ongoing throughout the evaluation and Dr. Renda Beckwith, Psy.D. was immediately accessible at all times. Dr. Renda Beckwith, Psy.D. provided supervision to the psychometrist on the date of this service to the extent necessary to assure the quality of all services provided.    Nicholas Neal will return within approximately 1-2 weeks for an interactive feedback session with Dr. Beckwith at which time his test performances, clinical impressions, and treatment recommendations will be reviewed in detail. [name] understands he can contact our office should he require our assistance before this time.  A total of 110 minutes of billable time were spent face-to-face with [name] by the psychometrist. This includes both test administration and scoring time. Billing for these services is reflected in the clinical report generated by Dr. Renda Beckwith, Psy.D.  This note reflects time spent with the psychometrician and does not include test scores or any clinical interpretations made by Dr. Beckwith. The full report will follow in a separate note.

## 2024-07-15 NOTE — Progress Notes (Unsigned)
 NEUROPSYCHOLOGICAL EVALUATION [place]. [facility]  [place] Department of Neurology  Date of Evaluation: 07/14/2024  REASON FOR REFERRAL   Nicholas Neal is an 83 year old, right-handed, White male with 18 years of formal education. He was referred for neuropsychological evaluation by Camie Sevin, PA-C, to assess current neurocognitive functioning, document potential cognitive deficits, and assist with treatment planning. This is his first neuropsychological evaluation.  SUMMARY OF RESULTS   Premorbid cognitive abilities are estimated to be in the average range based on word reading and sociodemographic factors. Given this estimate, performance today was impaired across tasks of learning/memory and language and variable across tasks of attention/working memory, processing speed, and executive functioning. The exception to this pattern was visuospatial functioning, which remained a relative strength and possibly reflects a longstanding personal skill consistent with his hobby of furniture building.   Specifically, performance was low on all three memory tests (i.e., word list, short stories, and shapes) across encoding, recall, and recognition, with the exception of relatively preserved recognition of the short stories. Although his immediate recall was limited to begin with, he was unable to spontaneously recall a single detail after a delay. Language scores were globally low, including deficits in verbal fluency and confrontation naming. Executive functioning was broadly low, with the exception of relatively preserved visual abstract reasoning; however, alternating attention, verbal abstract reasoning, and judgment were all poor. Processing speed was slowed on tasks requiring rapid decoding and visual attention/discrimination but remained relatively intact on a visual scanning task. Simple auditory working memory was intact, while complex auditory working memory was variable.  On  self-report questionnaires, he endorsed mild symptoms of anxiety and minimal symptoms of depression.  DIAGNOSTIC IMPRESSION   Results of the current evaluation indicated widespread variability in performance, with deficits noted across most cognitive domains--especially pronounced in learning, memory, and language. Although several test scores are low, his continued functional independence supports a diagnosis of mild cognitive impairment, likely at an advanced stage. Given the extent and nature of his cognitive impairments, there is significant concern for possible progression to dementia, highlighting the importance of ongoing monitoring and proactive planning. The overall clinical picture--including anosognosia, insidious onset of symptoms, gradual progression, widespread cognitive impairment, 0% retention, and little to no benefit from recognition cueing--raises concern for an underlying Alzheimer's disease process. While intermittent mood symptoms and mild vascular changes may be contributory or exacerbate cognitive difficulties, they alone are unlikely to fully explain the clinical presentation.   Serial assessment will be beneficial in clarifying the underlying etiology, monitoring his course, and adapting the treatment plan over time.  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 Reevaluation should occur during a period of medical and affective stability.  Discuss with neurology the risks and benefits of initiating a medication aimed at slowing memory decline. Additionally, consider the need for further diagnostic workup (e.g., CSF analysis, blood test, amyloid imaging) to better clarify the underlying cause of the cognitive impairment identified today.  Findings from this evaluation raise concern about the patient's ability to safely drive. If the patient wishes to continue driving, he is  strongly advised to pursue a formalized driving evaluation. Driving evaluations can be arranged through various organizations, including:  [employer] in [place]: [phone] [facility]: [phone] [facility]: [phone] [facility]: [phone] or [phone]  Continue treatment for depression, especially given that emotional distress can exacerbate cognitive difficulties. Talk with your prescribing provider regularly  to make sure your medications are working well for you.  Patient should continue to manage as many of his instrumental activities of daily living to the extent that it remains safe and appropriate. However, his wife should periodically monitor his functioning, and if significant concerns or errors arise, she should take a more active role in assisting or assuming those responsibilities.  Prioritize physical health through diet, exercise, and sleep. Regular physical activity supports cardiovascular health, improves mood, and helps preserve mobility and independence. A brain-healthy diet such as the Mediterranean or MIND diet is rich in fruits, vegetables, whole grains, healthy fats, and lean proteins, and has been associated with reduced risk of cognitive decline. Additionally, getting adequate, quality sleep and managing chronic conditions with the help of healthcare providers are essential components of healthy aging.  Continue to stay socially and mentally engaged. Maintaining strong social connections and regularly stimulating your brain can help protect against cognitive decline. This includes staying connected with friends and family, volunteering, or participating in community groups. Mentally engaging activities--such as reading, doing puzzles, playing strategy games, or learning a new language or musical instrument--promote brain plasticity.  Consider implementing compensatory strategies to maximize independence and  maintain daily functioning. Examples include:   Adhere to routine. Compensatory strategies work best when they are used consistently. Use a planner, calendar, or white board that has the schedule and important events for the day clearly listed to reference and cross off when tasks are complete.  Ask for written information, especially if it is new or unfamiliar (e.g., information provided at a doctor's appointment).  Create an organized environment. Keep items that can be easily misplaced in a sensible location and get into the habit of always returning the items to those places.  Pay attention and reduce distractions. Make a point of focusing attention on information you want to remember. One-on-one interaction is more likely to facilitate attention and minimize distraction. Make eye contact and repeat the information out loud after you hear it. Reduce interruptions or distractions especially when attempting to learn new information.  Create associations. When learning something new, think about and understand the information. Explain it in your own words or try to associate it with something you already know. Take notes to help remember important details. Evaluate goals and plan accordingly. When confronted by many different tasks, begin by making a list that prioritizes each task and estimates the time it will take to complete. Break down complicated tasks into smaller, more manageable steps.  Focus on one task at a time and complete each task before starting another. Avoid multitasking.  If patient requires legal assistance with durable powers of attorney, medical decision making, long-term care resource access, or other aspects of estate planning, he may consider contacting [employer] at [phone] for a free consultation.  DISPOSITION   Patient will follow up with the referring provider, [name]. He should return for repeat neuropsychological testing in 12-18 months to monitor his  course and assist with diagnosis and treatment planning. He and his wife will be provided verbal feedback in approximately one week regarding the findings and impression during this visit.  The remainder of the report includes the details of the patient's background and a table of results from the current evaluation, which support the summary and recommendations described above.  BACKGROUND   History of Presenting Illness: The following information was obtained from a review of medical records and an interview with the patient and his wife, Landry. Briefly, the patient  established care with Camie Sevin, PA-C, at [facility] on 03/15/2024 due to memory concerns. MoCA = 17/30. He was referred for neuropsychological evaluation accordingly.  Cognitive Functioning: During today's appointment, the patient and his wife reported cognitive changes over the past several years. Initially, they considered the changes minor and did not seek evaluation. However, more recently, they have observed a gradual increase in these issues. Despite this, the patient is not significantly bothered by the changes. While he acknowledged memory difficulties, he believes that if he lived alone and did not rely on his wife, his memory might function better. Both he and his wife reported concerns primarily related to short-term memory, such as difficulty remembering new information, names, and occasionally details of conversations. Patient denied forgetting recent events. He mentioned occasional word-finding difficulties, though his wife noted that this has always been the case. He does not feel he has significant attention issues. However, his wife has observed that he may be mentally preoccupied during conversations. They both agreed that his navigation remains intact when he is in familiar areas. His wife did note that he sometimes struggles to visualize locations--such as restaurants or streets--when named, but he does not appear  to get lost while driving. In terms of organization, he manages well with a physical calendar. His wife provides reminders for tasks that require planning in advance. Interestingly, he continues to build furniture, demonstrating the ability to plan and execute complex projects effectively.  Physical Functioning: Patient denied difficulties with both sleep initiation and maintenance. He takes a couple of short naps each day, typically lasting around 15 minutes. Appetite is stable. Taste and smell are stable; regarding the latter, he reported being able to detect odors but not always identify them. Vision and hearing are stable. He denied balance problems and falls but acknowledged being more intentional about walking slowly and carefully. Wife has observed a subtle bilateral hand tremor, though it was not visible today. Patient reported it is generally not problematic and that he remains able to work with his hands.  Emotional Functioning: Patient described his recent mood as really good. He denied suicidal ideation. He remains engaged throughout the day. He enjoys activities such as yard work, reading, walking, yoga, furniture building, and participating in [employer] groups.  Imaging: MRI of the brain (03/31/2024) documented moderate generalized cerebral atrophy and mild chronic small vessel ischemic disease.  Other Relevant Medical History: Remarkable for hypercholesterolemia and prediabetes. Please refer to the medical record for a more comprehensive problem list. No history of stroke, CNS infection, head injury, or seizure was reported.  Current Medications: Per record, atorvastatin and escitalopram.  Functional Status: Patient independently performs all basic activities of daily living without difficulty. He continues to drive without reported accidents, traffic violations, and navigational difficulties. He independently manages his medications without errors. His wife manages the finances, but this is  longstanding. He denied any difficulty operating household appliances or tools.  Family Neurological History: Remarkable for Alzheimer's disease in his father (died at age 36).  Psychiatric History: Remarkable for depression, usually more notable in the fall and winter months. Symptoms are currently managed with medication. History of anxiety, counseling, suicidal ideation, hallucinations, and psychiatric hospitalizations was not reported.  Substance Use History: Patient reported consuming two glasses of wine each evening. He denied current use of nicotine, marijuana, and other illicit substances.  Social and Developmental History: Patient was born and raised in [place] . Birth weight was low, estimated at approximately four to five pounds. History of perinatal complications  and developmental delays was not reported. He lives with his wife. They do not have children.  Educational and Occupational History: No history of childhood learning disability, special education services, or grade retention was reported. Patient described possible inattentiveness during elementary school but stated he was an average student overall. He earned mostly C grades in high school, which later improved to As and Bs in college. He graduated with top honors, earning a bachelor's degree in history and philosophy. He subsequently obtained a Master of Divinity degree and completed one year of certification training in clinical psychology, which he described as being similar to a residency. He worked as a Airline pilot until retiring approximately 14 years ago.  BEHAVIORAL OBSERVATIONS   Patient arrived on time and was accompanied by his wife, Landry. He ambulated independently and without gait disturbance. He was alert and oriented to person, city, and month but not to hospital, date, or year. He was appropriately groomed and dressed for the setting. No significant sensory or motor abnormalities were observed. Vision (with  glasses) and hearing were adequate for testing purposes. Speech was of normal rate, prosody, and volume. No conversational word-finding difficulties, paraphasic errors, or dysarthria were observed. Comprehension was conversationally intact. Thought processes were linear, logical, and coherent. Thought content was organized and devoid of delusions. Insight appeared fair. Affect was even and congruent with euthymic mood. He was cooperative and appeared to give adequate effort during testing. While one embedded measure of performance validity was low, it was on a memory task and, given his clinical presentation and overall pattern of scores, likely reflects true memory impairment. Therefore, while some caution is warranted, the results are thought to accurately reflect his cognitive functioning at this time.  NEUROPSYCHOLOGICAL TESTING RESULTS   Tests Administered: Animal Naming Test; Brief Visuospatial Memory Test-Revised (BVMT-R) - Form 1; [place]  Verbal Learning Test Third Edition (CVLT3) - Brief Form; Controlled Oral Word Association Test (COWAT): FAS; Geriatric Anxiety Scale-10 Item (GAS-10); Geriatric Depression Scale Short Form (GDS-SF); Neuropsychological Assessment Battery (NAB) Form 1 - Subtest(s): Naming, Judgement; Repeatable Battery for the Assessment of Neuropsychological Status Update (RBANS Update) Form A - Subtest(s): Line Orientation; Test of Premorbid Functioning (TOPF); Trail Making Test (TMT); Wechsler Adult Intelligence Scale Fifth Edition (WAIS-5) - Subtest(s): Similarities, Clinical cytogeneticist, Matrix Reasoning, Digits Forward, Digit Sequencing, Coding, Symbol Search, Digits Backward; and Wechsler Memory Scale Fourth Edition (WMS-IV) - Subtest(s): Logical Memory (LM).  Test results are provided in the table below. Whenever possible, the patient's scores were compared against age-, sex-, and education-corrected normative samples. Interpretive descriptions are based on the AACN consensus  conference statement on uniform labeling (Guilmette et al., 2020).  PREMORBID FUNCTIONING RAW  RANGE  TOPF 49 StdS=107 Average  ATTENTION & WORKING MEMORY RAW  RANGE  WAIS-5 Digits Forward -- ss=10 Average  WAIS-5 Digits Backward -- ss=7 Low Average  WAIS-5 Digit Sequencing -- ss=3 Exceptionally Low  PROCESSING SPEED RAW  RANGE  Trails A 51''0e T=41 Low Average  WAIS-5 Coding  -- ss=5 Below Average  WAIS-5 Symbol Search -- ss=5 Below Average  EXECUTIVE FUNCTION RAW  RANGE  Trails B D/C -- --  WAIS-5 Matrix Reasoning -- ss=6 Low Average  WAIS-5 Similarities -- ss=3 Exceptionally Low  COWAT Letter Fluency 9+3+8 T=30 Below Average  NAB Judgement -- T=26 Exceptionally Low  LANGUAGE RAW  RANGE  COWAT Letter Fluency 9+3+8 T=30 Below Average  Animal Naming Test 11 T=31 Below Average  NAB Naming Test 23/31 +1 w/ PC T=26 BNL  VISUOSPATIAL RAW  RANGE  RBANS Line Orientation -- 26-50%ile Average  WAIS-5 Block Design -- ss=12 High Average  BVMT-R Copy Trial 11/12 -- WNL  VERBAL LEARNING & MEMORY RAW  RANGE  CVLT3 Total 1-4 2,3,5,5 StdS=69 Exceptionally Low  CVLT3 SDFR  4/9 ss=5 Below Average  CVLT3 LDFR  0/9 ss=1 Exceptionally Low  CVLT3 LDCR  2/9 ss=1 Exceptionally Low  CVLT3 Recognition Hits 7 ss=8 Average  CVLT3 Recognition False+ 10 ss=1 Exceptionally Low  CVLT3 Discriminability -- ss=1 Exceptionally Low  CVLT3 Intrusions 1 ss=10 Average  CVLT3 Repetitions 2 ss=8 Average  CVLT3 Forced Choice 7/9 -- BNL  WMS-IV LM-I  (4+9+0)/53 ss=5 Below Average  WMS-IV LM-II  (0+0)/39 ss=1 Exceptionally Low  WMS-IV LM Recognition  (6+8)/23 10-16%ile Low Average  VISUAL LEARNING & MEMORY RAW  RANGE  BVMT-R Trial 1 2/12 T=40 Low Average  BVMT-R Trial 2 0/12 T=25 Exceptionally Low  BVMT-R Trial 3 2/12 T=30 Below Average  BVMT-R Total Recall 4/36 T=30 Below Average  BVMT-R Delayed Recall 0/12 T=26 Exceptionally Low  BVMT-R Recognition Hits 6 -- --  BVMT-R Recognition False Alarms 3 -- --  BVMT-R  Recognition Discrimination Index 3 T=29 Exceptionally Low  *From Powell et al. (2022) -- -- --  QUESTIONNAIRES RAW  RANGE  GDS-SF 2 -- Minimal  GAS-10 7 -- Mild  *Note: ss = scaled score; StdS = standard score; T = t-score; C/S = corrected raw score; WNL = within normal limits; BNL= below normal limits; D/C = discontinued. Scores from skewed distributions are typically interpreted as WNL (>=16th %ile) or BNL (<16th %ile).   INFORMED CONSENT   Patient was provided with a verbal description of the nature and purpose of the neuropsychological evaluation. Also reviewed were the foreseeable risks and/or discomforts and benefits of the procedure, limits of confidentiality, and mandatory reporting requirements of this provider. Patient was given the opportunity to have their questions answered. Oral consent to participate was provided by the patient.   This report was prepared as part of a clinical evaluation and is not intended for forensic use.  SERVICE   This evaluation was conducted by Renda Beckwith, Psy.D. In addition to time spent directly with the patient, total professional time (180 minutes) includes record review, integration of relevant medical history, test selection, interpretation of findings, and report preparation. A technician, Luke Pitcher, B.S., provided testing and scoring assistance (110 minutes).  Psychiatric Diagnostic Evaluation Services (Professional): 09208 x 1 Neuropsychological Testing Evaluation Services (Professional): 03867 x 1 Neuropsychological Testing Evaluation Services (Professional): 03866 x 2 Neuropsychological Test Administration and Scoring (Technician): [phone] x 1 Neuropsychological Test Administration and Scoring (Technician): [phone] x 3  This report was generated using voice recognition software. While this document has been carefully reviewed, transcription errors may be present. I apologize in advance for any inconvenience. Please contact me if further  clarification is needed.            Renda Beckwith, Psy.D.             Neuropsychologist

## 2024-07-16 ENCOUNTER — Encounter: Payer: Self-pay | Admitting: Psychology

## 2024-07-20 ENCOUNTER — Ambulatory Visit: Admitting: Physician Assistant

## 2024-07-21 ENCOUNTER — Ambulatory Visit: Admitting: Psychology

## 2024-07-21 DIAGNOSIS — G3184 Mild cognitive impairment, so stated: Secondary | ICD-10-CM | POA: Diagnosis not present

## 2024-07-21 NOTE — Progress Notes (Signed)
   NEUROPSYCHOLOGY FEEDBACK SESSION [place]. [facility]  [place] Department of Neurology  Date of Feedback Session: 07/21/2024  REASON FOR REFERRAL   Nicholas Neal is an 83 year old, right-handed, White male with 18 years of formal education. He was referred for neuropsychological evaluation by Camie Sevin, PA-C, to assess current neurocognitive functioning, document potential cognitive deficits, and assist with treatment planning. This is his first neuropsychological evaluation.  FEEDBACK   Patient completed a comprehensive neuropsychological evaluation on 07/14/2024. Please refer to that encounter for the full report and recommendations. Briefly, results indicated widespread variability in performance, with deficits noted across most cognitive domains--especially pronounced in learning, memory, and language. Although several test scores are low, his continued functional independence supports a diagnosis of mild cognitive impairment, likely at an advanced stage. Given the extent and nature of his cognitive impairments, there is significant concern for possible progression to dementia, highlighting the importance of ongoing monitoring and proactive planning. The overall clinical picture--including anosognosia, insidious onset of symptoms, gradual progression, widespread cognitive impairment, 0% retention, and little to no benefit from recognition cueing--raises concern for an underlying Alzheimer's disease process. While intermittent mood symptoms and mild vascular changes may be contributory or exacerbate cognitive difficulties, they alone are unlikely to fully explain the clinical presentation.   Today, the patient was unaccompanied but his wife joined the feedback session over the phone. They were provided verbal feedback regarding the findings and impression during this visit, and their questions were answered. A copy of the report was provided at the conclusion of the  visit.  DISPOSITION   Patient will follow up with the referring provider, [name]. He should return for repeat neuropsychological testing in 12-18 months to monitor his course and assist with diagnosis and treatment planning.  SERVICE   This feedback session was conducted by Renda Beckwith, Psy.D. [place] unit of 03867 (35 minutes) was billed for Dr. Beckwith' time spent in preparing, conducting, and documenting the current feedback session.  This report was generated using voice recognition software. While this document has been carefully reviewed, transcription errors may be present. I apologize in advance for any inconvenience. Please contact me if further clarification is needed.

## 2024-07-26 DIAGNOSIS — Z0000 Encounter for general adult medical examination without abnormal findings: Secondary | ICD-10-CM | POA: Diagnosis not present

## 2024-07-26 DIAGNOSIS — M720 Palmar fascial fibromatosis [Dupuytren]: Secondary | ICD-10-CM | POA: Diagnosis not present

## 2024-07-26 DIAGNOSIS — Z6824 Body mass index (BMI) 24.0-24.9, adult: Secondary | ICD-10-CM | POA: Diagnosis not present

## 2024-07-26 DIAGNOSIS — R7303 Prediabetes: Secondary | ICD-10-CM | POA: Diagnosis not present

## 2024-07-26 DIAGNOSIS — F322 Major depressive disorder, single episode, severe without psychotic features: Secondary | ICD-10-CM | POA: Diagnosis not present

## 2024-07-26 DIAGNOSIS — E7800 Pure hypercholesterolemia, unspecified: Secondary | ICD-10-CM | POA: Diagnosis not present

## 2024-08-02 NOTE — Progress Notes (Signed)
 Assessment/Plan:    Amnestic Mild cognitive impairment, advanced, concern for Alzheimer's Disease  Nicholas Neal is a very pleasant 83 y.o. RH male, retired Airline pilot  with a history of HTN, HLD, prediabetes and a new diagnosis of amnestic MCI, advanced, with concerns for Alzheimer's Disease, presenting today in follow-up for evaluation of memory loss. Discussed starting donepezil  10 mg daily in an effort to slow down cognitive decline, patient agrees.  Patient is able to participate on ADLs and to drive without difficulties. Mood is stable.        Recommendations:   Follow up in 6 months. Repeat neuropsych evaluation in 10-16 months for diagnostic clarity and disease trajectory  Start Donepezil  10 mg :Take half tablet (5 mg) daily for 2 weeks, then increase to the full tablet at 10 mg daily. Side effects discussed   Recommend good control of cardiovascular risk factors Continue to control mood as per PCP Monitor driving Replenish B1 and A87, borderline normal     Subjective:   This patient is accompanied in the office by his wife  who supplements the history. Previous records as well as any outside records available were reviewed prior to todays visit.  Patient was last seen on 03/15/24 with [facility] 17/30 .    Any changes in memory since last visit? . As before, he reports  some difficulty remembering recent conversations and names of people even when familiar to him. He enjoys building furniture and reading but  mostly online.  repeats oneself?  Endorsed, within a 15 minute period.  Disoriented when walking into a room?  Patient denies    Misplacing objects?  Patient denies   Wandering behavior?   Denies. Any personality changes since last visit? Denies.   Any worsening depression?: denies.   Hallucinations or paranoia?  Denies.   Seizures?   Denies.    Any sleep changes? Sleeps well  Denies vivid dreams, REM behavior or sleepwalking   Sleep apnea?   He does take 2  naps throughout the day, one in the morning and one in the afternoon.     Any hygiene concerns?   Denies.   Independent of bathing and dressing?  Endorsed  Does the patient needs help with medications? Patient is in charge   Who is in charge of the finances?Wife is in charge     Any changes in appetite?  denies     Patient have trouble swallowing?  Denies.   Does the patient cook? No. Any kitchen accidents such as leaving the stove on?   Denies.   Any headaches?    Denies.   Vision changes? Denies. Chronic pain?  Denies.   Ambulates with difficulty? Denies. Likes to walk with his  wife and his dogs.    Recent falls or head injuries?    Denies.      Unilateral weakness, numbness or tingling?  Denies.   Any tremors?  Denies.   Any anosmia?  Endorsed  Any incontinence of urine? Some frequency  Any bowel dysfunction?  Denies.      Patient lives with his wife Does the patient drive? Yes, but forget the streets sometimes.   Alcohol: 1-2 glasses wine a day  Neuropsych evaluation 07/14/2024 Dr. Gayland . Briefly, results indicated widespread variability in performance, with deficits noted across most cognitive domains--especially pronounced in learning, memory, and language. Although several test scores are low, his continued functional independence supports a diagnosis of mild cognitive impairment, likely at an advanced stage. Given the  extent and nature of his cognitive impairments, there is significant concern for possible progression to dementia, highlighting the importance of ongoing monitoring and proactive planning. The overall clinical picture--including anosognosia, insidious onset of symptoms, gradual progression, widespread cognitive impairment, 0% retention, and little to no benefit from recognition cueing--raises concern for an underlying Alzheimer's disease process. While intermittent mood symptoms and mild vascular changes may be contributory or exacerbate cognitive difficulties, they  alone are unlikely to fully explain the clinical presentation.      Initial visit 03/2024  How long did patient have memory difficulties?  For about long time, 15 years-I used to call everyone by their names or recite poems and now I can't.  Patient reports some difficulty remembering new information, recent conversations, activities, names. Always had word finding problems so I have never been worried, and I never been able to remember content in music, lyrics, but lately  I am having issues with names of people such as my friend Alm and Macario, and he did not recognize the name of the restaurantwife adds. He likes to read, not as much as before. He used to like reading novels but I distract myself instead with other things. Likes to build furniture with precision. repeats oneself?  Endorsed, for the last 3 years, 15 mins later he may ask again.  Disoriented when walking into a room?  Patient denies    Leaving objects in unusual places?  Denies.   Wandering behavior? Denies.   Any personality changes, or depression, anxiety? Denies  Hallucinations or paranoia? Denies.   Seizures? Denies.    Any sleep changes?  Sleeps well. Denies any frequent nightmares or dream reenactment, other REM behavior or sleepwalking   Sleep apnea? He reports that he takes a nap in the morning and then another in the afternoon. HE may have a sleep evaluation in the future Any hygiene concerns?  Denies.   Independent of bathing and dressing? Endorsed  Does the patient need help with medications? Patient is in charge   Who is in charge of the finances? Wife has always been in charge     Any changes in appetite?  Lately he is more careful with food, especially protein.  Has seen a nutritionist recently.  Pushing water Patient have trouble swallowing?  Denies.   Does the patient cook? No  Any headaches?  Denies.   Chronic pain? Denies.   Ambulates with difficulty? Denies. Walks frequently really fast in the  park Recent falls or head injuries? Denies.     Vision changes?  Denies any new issues.  Has a history of cataracts removed Any strokelike symptoms? Denies.   Any tremors? Denies.    Any anosmia?I don't smell much at all. Any incontinence of urine? Some frequency.   Any bowel dysfunction? Denies.      Patient lives with wife.    History of heavy alcohol intake?1-2 glasses a day.   History of heavy tobacco use? Denies.   Family history of dementia? Father had AD dementia  Does patient drive? Yes, denies getting lost but cannot remember the streets anymore, I forgot the streets.   Retired psychotherapist     MRI brain 03/2024, personally reviewed remarkable for  mild chronic ischemic changes within the cerebral white matter,  moderate generalized cerebral atrophy, no acute findings        Past Medical History:  Diagnosis Date   Anxiety    Hyperlipidemia      Past Surgical History:  Procedure Laterality Date  CATARACTS     TONSILLECTOMY AND ADENOIDECTOMY     as a child   TRIGGER FINGER RELEASE Left    pinky finger     PREVIOUS MEDICATIONS:   CURRENT MEDICATIONS:  Outpatient Encounter Medications as of 08/03/2024  Medication Sig   atorvastatin (LIPITOR) 10 MG tablet Take 10 mg by mouth daily.   donepezil  (ARICEPT ) 10 MG tablet Take half tablet (5 mg) daily for 2 weeks, then increase to the full tablet at 10 mg daily   escitalopram (LEXAPRO) 20 MG tablet Take 20 mg by mouth daily. (Patient taking differently: Take 20 mg by mouth daily. 10mg  qd)   atorvastatin (LIPITOR) 10 MG tablet Take 20 mg by mouth daily.   cephALEXin  (KEFLEX ) 500 MG capsule Take 500 mg by mouth 4 (four) times daily.   No facility-administered encounter medications on file as of 08/03/2024.     Objective:     PHYSICAL EXAMINATION:    VITALS:   Vitals:   08/03/24 1444  BP: (!) 148/78  Pulse: 60  Resp: 20  SpO2: 98%  Weight: 163 lb (73.9 kg)  Height: 5' 9 (1.753 m)    GEN:  The  patient appears stated age and is in NAD. HEENT:  Normocephalic, atraumatic.   Neurological examination:  General: NAD, well-groomed, appears stated age. Orientation: The patient is alert. Oriented to person, place and not to date.  Cranial nerves: There is good facial symmetry.The speech is fluent and clear. No aphasia or dysarthria. Fund of knowledge is appropriate. Recent and remote memory impaired.  Attention and concentration are normal.  Able to name objects and repeat phrases.  Hearing is intact to conversational tone.  Sensation: Sensation is intact to light touch throughout Motor: Strength is at least antigravity x4. DTR's 2/4 in UE/LE      03/15/2024    1:00 PM  Montreal Cognitive Assessment   Visuospatial/ Executive (0/5) 2  Naming (0/3) 2  Attention: Read list of digits (0/2) 2  Attention: Read list of letters (0/1) 1  Attention: Serial 7 subtraction starting at 100 (0/3) 3  Language: Repeat phrase (0/2) 2  Language : Fluency (0/1) 0  Abstraction (0/2) 2  Delayed Recall (0/5) 0  Orientation (0/6) 3  Total 17        No data to display             Movement examination: Tone: There is normal tone in the UE/LE Abnormal movements:  no tremor.  No myoclonus.  No asterixis.   Coordination:  There is no decremation with RAM's. Normal finger to nose  Gait and Station: The patient has no difficulty arising out of a deep-seated chair without the use of the hands. The patient's stride length is good.  Gait is cautious and narrow.   Thank you for allowing us  the opportunity to participate in the care of this nice patient. Please do not hesitate to contact us  for any questions or concerns.   Total time spent on today's visit was 31 minutes dedicated to this patient today, preparing to see patient, examining the patient, ordering tests and/or medications and counseling the patient, documenting clinical information in the EHR or other health record, independently interpreting  results and communicating results to the patient/family, discussing treatment and goals, answering patient's questions and coordinating care.  Cc:  Frederik Charleston, MD  Camie Sevin 08/03/2024 3:19 PM

## 2024-08-03 ENCOUNTER — Encounter: Payer: Self-pay | Admitting: Physician Assistant

## 2024-08-03 ENCOUNTER — Ambulatory Visit (INDEPENDENT_AMBULATORY_CARE_PROVIDER_SITE_OTHER): Admitting: Physician Assistant

## 2024-08-03 VITALS — BP 148/78 | HR 60 | Resp 20 | Ht 69.0 in | Wt 163.0 lb

## 2024-08-03 DIAGNOSIS — G3184 Mild cognitive impairment, so stated: Secondary | ICD-10-CM | POA: Diagnosis not present

## 2024-08-03 MED ORDER — DONEPEZIL HCL 10 MG PO TABS: ORAL_TABLET | ORAL | 11 refills | Status: AC

## 2024-08-03 NOTE — Patient Instructions (Signed)
 We will start donepezil  half tablet (5mg ) daily for 2  weeks.  If you are tolerating the medication, then after 2 weeks, we will increase the dose to a full tablet of 10 mg daily.  Follow up in 6 months

## 2024-09-18 DIAGNOSIS — W268XXA Contact with other sharp object(s), not elsewhere classified, initial encounter: Secondary | ICD-10-CM | POA: Diagnosis not present

## 2024-09-18 DIAGNOSIS — Z23 Encounter for immunization: Secondary | ICD-10-CM | POA: Diagnosis not present

## 2024-09-18 DIAGNOSIS — S61310A Laceration without foreign body of right index finger with damage to nail, initial encounter: Secondary | ICD-10-CM | POA: Diagnosis not present

## 2025-01-31 ENCOUNTER — Ambulatory Visit: Admitting: Physician Assistant
